# Patient Record
Sex: Female | Born: 1990
Health system: Southern US, Community
[De-identification: ages and names within clinical notes are randomized; demographics above are authoritative.]

## PROBLEM LIST (undated history)

## (undated) DIAGNOSIS — S52509A Unspecified fracture of the lower end of unspecified radius, initial encounter for closed fracture: Secondary | ICD-10-CM

## (undated) DIAGNOSIS — S42309A Unspecified fracture of shaft of humerus, unspecified arm, initial encounter for closed fracture: Secondary | ICD-10-CM

## (undated) DIAGNOSIS — S5291XA Unspecified fracture of right forearm, initial encounter for closed fracture: Secondary | ICD-10-CM

## (undated) DIAGNOSIS — R002 Palpitations: Secondary | ICD-10-CM

## (undated) DIAGNOSIS — E739 Lactose intolerance, unspecified: Secondary | ICD-10-CM

## (undated) DIAGNOSIS — G43909 Migraine, unspecified, not intractable, without status migrainosus: Secondary | ICD-10-CM

## (undated) DIAGNOSIS — J45909 Unspecified asthma, uncomplicated: Secondary | ICD-10-CM

## (undated) DIAGNOSIS — E162 Hypoglycemia, unspecified: Secondary | ICD-10-CM

## (undated) DIAGNOSIS — F4310 Post-traumatic stress disorder, unspecified: Secondary | ICD-10-CM

## (undated) HISTORY — PX: KNEE SURGERY: SHX244

## (undated) HISTORY — DX: Unspecified fracture of shaft of humerus, unspecified arm, initial encounter for closed fracture: S42.309A

## (undated) HISTORY — DX: Unspecified fracture of the lower end of unspecified radius, initial encounter for closed fracture: S52.509A

## (undated) HISTORY — DX: Palpitations: R00.2

## (undated) HISTORY — DX: Lactose intolerance, unspecified: E73.9

## (undated) HISTORY — DX: Post-traumatic stress disorder, unspecified: F43.10

## (undated) HISTORY — PX: WISDOM TOOTH EXTRACTION: SHX21

## (undated) HISTORY — DX: Unspecified fracture of right forearm, initial encounter for closed fracture: S52.91XA

## (undated) HISTORY — PX: CLAVICLE SURGERY: SHX598

---
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
## 2009-05-25 ENCOUNTER — Ambulatory Visit: Payer: Self-pay | Admitting: Internal Medicine

## 2010-01-16 ENCOUNTER — Ambulatory Visit: Payer: Self-pay | Admitting: Orthopedic Surgery

## 2010-01-23 ENCOUNTER — Ambulatory Visit: Payer: Self-pay | Admitting: Orthopedic Surgery

## 2011-07-09 DIAGNOSIS — J45909 Unspecified asthma, uncomplicated: Secondary | ICD-10-CM | POA: Insufficient documentation

## 2011-07-09 DIAGNOSIS — E162 Hypoglycemia, unspecified: Secondary | ICD-10-CM | POA: Insufficient documentation

## 2012-08-24 DIAGNOSIS — Z309 Encounter for contraceptive management, unspecified: Secondary | ICD-10-CM | POA: Insufficient documentation

## 2012-08-24 DIAGNOSIS — Z124 Encounter for screening for malignant neoplasm of cervix: Secondary | ICD-10-CM | POA: Insufficient documentation

## 2014-11-29 ENCOUNTER — Ambulatory Visit
Admission: EM | Admit: 2014-11-29 | Discharge: 2014-11-29 | Disposition: A | Discharge status: Home / Self Care | Payer: Self-pay | Attending: Family Medicine | Admitting: Family Medicine

## 2014-11-29 ENCOUNTER — Ambulatory Visit: Admission: EM | Admit: 2014-11-29 | Discharge: 2014-11-29 | Discharge status: Absent without leave | Payer: Self-pay

## 2014-11-29 ENCOUNTER — Encounter: Payer: Self-pay | Admitting: Family Medicine

## 2014-11-29 DIAGNOSIS — M940 Chondrocostal junction syndrome [Tietze]: Secondary | ICD-10-CM

## 2014-11-29 DIAGNOSIS — S29012A Strain of muscle and tendon of back wall of thorax, initial encounter: Secondary | ICD-10-CM

## 2014-11-29 HISTORY — DX: Unspecified asthma, uncomplicated: J45.909

## 2014-11-29 HISTORY — DX: Migraine, unspecified, not intractable, without status migrainosus: G43.909

## 2014-11-29 HISTORY — DX: Hypoglycemia, unspecified: E16.2

## 2014-11-29 MED ORDER — CYCLOBENZAPRINE HCL 10 MG PO TABS: 10.0000 mg | ORAL_TABLET | Freq: Every day | ORAL | Status: DC

## 2014-11-29 NOTE — ED Provider Notes (Signed)
CSN: [id number]     Arrival date & time 11/29/14  1541 History   First MD Initiated Contact with Patient 11/29/14 1619     Chief Complaint  Patient presents with  . Chest Pain   (Consider location/radiation/quality/duration/timing/severity/associated sxs/prior Treatment) Patient is a 24 y.o. female presenting with chest pain. The history is provided by the patient.  Chest Pain Pain location:  L chest Pain quality: sharp and stabbing   Pain quality: not aching, not burning, not crushing, no pressure and no tightness   Pain radiates to:  Does not radiate Pain radiates to the back: no   Pain severity:  Mild Onset quality:  Sudden Duration:  6 hours Timing:  Intermittent Progression:  Waxing and waning Context: at rest   Worsened by:  Certain positions, movement and deep breathing Ineffective treatments:  None tried Associated symptoms: no fever, no numbness, no orthopnea, no palpitations, no PND, no shortness of breath, no syncope, not vomiting and no weakness   Risk factors comment:  States had a recent URI with coughing last week and also has a 89 yr old child whom she picks up and carries   Past Medical History  Diagnosis Date  . Asthma   . Migraines   . Hypoglycemia    Past Surgical History  Procedure Laterality Date  . Clavicle surgery Left 2007   Family History  Problem Relation Age of Onset  . Bipolar disorder Mother   . Hypothyroidism Mother   . Restless legs syndrome Mother   . Neuropathy Father    History  Substance Use Topics  . Smoking status: Never Smoker   . Smokeless tobacco: Not on file  . Alcohol Use: Yes     Comment: occasional   OB History    Gravida Para Term Preterm AB TAB SAB Ectopic Multiple Living   1              Review of Systems  Constitutional: Negative for fever.  Respiratory: Negative for shortness of breath.   Cardiovascular: Positive for chest pain. Negative for palpitations, orthopnea, syncope and PND.  Gastrointestinal:  Negative for vomiting.  Neurological: Negative for weakness and numbness.    Allergies  Morphine and related and Percocet  Home Medications   Prior to Admission medications   Medication Sig Start Date End Date Taking? Authorizing Provider  albuterol (PROVENTIL HFA;VENTOLIN HFA) 108 (90 BASE) MCG/ACT inhaler Inhale 2 puffs into the lungs every 6 (six) hours as needed for wheezing or shortness of breath.   Yes Historical Provider, MD  etonogestrel (NEXPLANON) 68 MG IMPL implant 1 each by Subdermal route once.   Yes Historical Provider, MD  ondansetron (ZOFRAN) 4 MG tablet Take 4 mg by mouth every 8 (eight) hours as needed for nausea or vomiting.   Yes Historical Provider, MD  SUMAtriptan (IMITREX) 100 MG tablet Take 100 mg by mouth every 2 (two) hours as needed for migraine. May repeat in 2 hours if headache persists or recurs.   Yes Historical Provider, MD  cyclobenzaprine (FLEXERIL) 10 MG tablet Take 1 tablet (10 mg total) by mouth at bedtime. 11/29/14   Payton Mccallum, MD   BP 105/72 mmHg  Pulse 80  Temp(Src) 98 F (36.7 C) (Oral)  Resp 16  Ht  (1.727 m)  Wt 175 lb (79.379 kg)  BMI 26.61 kg/m2  SpO2 100%  LMP 11/27/2014 Physical Exam  Constitutional: She appears well-developed and well-nourished. No distress.  HENT:  Head: Normocephalic and atraumatic.  Cardiovascular: Normal  rate, regular rhythm, normal heart sounds and intact distal pulses.   No murmur heard. Pulmonary/Chest: Effort normal and breath sounds normal. No respiratory distress. She has no wheezes. She has no rales. She exhibits tenderness (reproducible left upper chest wall tenderness to palpation).  Musculoskeletal:       Cervical back: She exhibits tenderness (tenderness and spasm to left trapezius muscle).       Back:  Neurological: She is alert.  Skin: She is not diaphoretic.    ED Course  Procedures (including critical care time) Labs Review Labs Reviewed - No data to display  Imaging Review No  results found.   MDM   1. Costochondritis   2. Upper back strain, initial encounter    Discharge Medication List as of 11/29/2014  4:32 PM    START taking these medications   Details  cyclobenzaprine (FLEXERIL) 10 MG tablet Take 1 tablet (10 mg total) by mouth at bedtime., Starting 11/29/2014, Until Discontinued, Normal      Plan: 1.Diagnosis reviewed with patient 2. rx as per orders; risks, benefits, potential side effects reviewed with patient 3. Recommend supportive treatment with rest, ice, gentle stretching 4. F/u prn if symptoms worsen or don't improve    Payton Mccallumrlando Aunisty Reali, MD 11/29/14 [phone]

## 2014-11-29 NOTE — Discharge Instructions (Signed)
Costochondritis Costochondritis, sometimes called Tietze syndrome, is a swelling and irritation (inflammation) of the tissue (cartilage) that connects your ribs with your breastbone (sternum). It causes pain in the chest and rib area. Costochondritis usually goes away on its own over time. It can take up to 6 weeks or longer to get better, especially if you are unable to limit your activities. CAUSES  Some cases of costochondritis have no known cause. Possible causes include:  Injury (trauma).  Exercise or activity such as lifting.  Severe coughing. SIGNS AND SYMPTOMS  Pain and tenderness in the chest and rib area.  Pain that gets worse when coughing or taking deep breaths.  Pain that gets worse with specific movements. DIAGNOSIS  Your health care provider will do a physical exam and ask about your symptoms. Chest X-rays or other tests may be done to rule out other problems. TREATMENT  Costochondritis usually goes away on its own over time. Your health care provider may prescribe medicine to help relieve pain. HOME CARE INSTRUCTIONS   Avoid exhausting physical activity. Try not to strain your ribs during normal activity. This would include any activities using chest, abdominal, and side muscles, especially if heavy weights are used.  Apply ice to the affected area for the first 2 days after the pain begins.  Put ice in a plastic bag.  Place a towel between your skin and the bag.  Leave the ice on for 20 minutes, 2-3 times a day.  Only take over-the-counter or prescription medicines as directed by your health care provider. SEEK MEDICAL CARE IF:  You have redness or swelling at the rib joints. These are signs of infection.  Your pain does not go away despite rest or medicine. SEEK IMMEDIATE MEDICAL CARE IF:   Your pain increases or you are very uncomfortable.  You have shortness of breath or difficulty breathing.  You cough up blood.  You have worse chest pains,  sweating, or vomiting.  You have a fever or persistent symptoms for more than 2-3 days.  You have a fever and your symptoms suddenly get worse. MAKE SURE YOU:   Understand these instructions.  Will watch your condition.  Will get help right away if you are not doing well or get worse. Document Released: 04/02/2005 Document Revised: 04/13/2013 Document Reviewed: 01/25/2013 ExitCare Patient Information 2015 [employer]. This information is not intended to replace advice given to you by your health care provider. Make sure you discuss any questions you have with your health care provider.  

## 2014-11-29 NOTE — ED Notes (Signed)
Patient states all day today she has been having chest tightness off and on. In the last 2 hours she has had chest pain and her left hand feels cold.

## 2015-01-18 ENCOUNTER — Ambulatory Visit
Admission: EM | Admit: 2015-01-18 | Discharge: 2015-01-18 | Disposition: A | Discharge status: Home / Self Care | Payer: Managed Care, Other (non HMO) | Attending: Internal Medicine | Admitting: Internal Medicine

## 2015-01-18 ENCOUNTER — Encounter: Payer: Self-pay | Admitting: Emergency Medicine

## 2015-01-18 DIAGNOSIS — B349 Viral infection, unspecified: Secondary | ICD-10-CM

## 2015-01-18 DIAGNOSIS — R51 Headache: Secondary | ICD-10-CM

## 2015-01-18 DIAGNOSIS — R519 Headache, unspecified: Secondary | ICD-10-CM

## 2015-01-18 MED ORDER — KETOROLAC TROMETHAMINE 10 MG PO TABS: 10.0000 mg | ORAL_TABLET | Freq: Four times a day (QID) | ORAL | Status: DC | PRN

## 2015-01-18 MED ORDER — SUMATRIPTAN SUCCINATE 6 MG/0.5ML ~~LOC~~ SOAJ: 1.0000 | Freq: Once | SUBCUTANEOUS | Status: DC

## 2015-01-18 MED ORDER — SUMATRIPTAN SUCCINATE 6 MG/0.5ML ~~LOC~~ SOLN
6.0000 mg | Freq: Once | SUBCUTANEOUS | Status: AC
  Administered 2015-01-18: 6 mg via SUBCUTANEOUS

## 2015-01-18 MED ORDER — PROMETHAZINE HCL 25 MG/ML IJ SOLN
25.0000 mg | Freq: Once | INTRAMUSCULAR | Status: AC
  Administered 2015-01-18: 25 mg via INTRAMUSCULAR

## 2015-01-18 MED ORDER — KETOROLAC TROMETHAMINE 60 MG/2ML IM SOLN
60.0000 mg | Freq: Once | INTRAMUSCULAR | Status: AC
  Administered 2015-01-18: 60 mg via INTRAMUSCULAR

## 2015-01-18 NOTE — Discharge Instructions (Signed)
Anticipate improvement in headache, other symptoms, gradually over the next couple days. Prescriptions for imitrex injection and toradol tablets (anti inflammatory) were sent to the pharmacy.  Viral Infections A virus is a type of germ. Viruses can cause:  Minor sore throats.  Aches and pains.  Headaches.  Runny nose.  Rashes.  Watery eyes.  Tiredness.  Coughs.  Loss of appetite.  Feeling sick to your stomach (nausea).  Throwing up (vomiting).  Watery poop (diarrhea). HOME CARE   Only take medicines as told by your doctor.  Drink enough water and fluids to keep your pee (urine) clear or pale yellow. Sports drinks are a good choice.  Get plenty of rest and eat healthy. Soups and broths with crackers or rice are fine. GET HELP RIGHT AWAY IF:   You have a very bad headache.  You have shortness of breath.  You have chest pain or neck pain.  You have an unusual rash.  You cannot stop throwing up.  You have watery poop that does not stop.  You cannot keep fluids down.  You or your child has a temperature by mouth above 102 F (38.9 C), not controlled by medicine.  Your baby is older than 3 months with a rectal temperature of 102 F (38.9 C) or higher.  Your baby is 3 months old or younger with a rectal temperatu45re of 100.4 F (38 C) or higher. MAKE SURE YOU:   Understand these instructions.  Will watch this condition.  Will get help right away if you are not doing well or get worse. Document Released: 06/05/2008 Document Revised: 09/15/2011 Document Reviewed: 10/29/2010 [facility] Patient Information 2015 [place], [place]. This information is not intended to replace advice given to you by your health care provider. Make sure you discuss any questions you have with your health care provider.

## 2015-01-18 NOTE — ED Notes (Addendum)
Patient c/o migraine headache that started yesterday.  Patient states N/V started this morning. Patient states that she took some Imitrex around 5:00am this morning.  Patient states that she took some Zofran around 5:30am this morning but vomited soon after taking it.

## 2015-01-18 NOTE — ED Provider Notes (Signed)
CSN: [id number]     Arrival date & time 01/18/15  0741 History   First MD Initiated Contact with Patient 01/18/15 [phone]     Chief Complaint  Patient presents with  . Migraine  . Emesis    HPI Patient is a 24 y.o. female presenting with migraines and vomiting.  Migraine  Emesis  She has a young child at home, was diagnosed a few days ago with hand-foot-and-mouth disease, and an upper respiratory infection. Patient has a headache since yesterday that started at the back of her head, radiates up over the top, which is a little bit different from her usual headache pattern. No stiff neck, no fever. She has had some runny/congested nose, a little cough. Headache yesterday responded to Imitrex at home; unfortunately, headache is flared up again this morning, and she vomited up the Zofran she took earlier to help.  Past Medical History  Diagnosis Date  . Asthma   . Migraines   . Hypoglycemia    Past Surgical History  Procedure Laterality Date  . Clavicle surgery Left 2007  . Knee surgery Left    Family History  Problem Relation Age of Onset  . Bipolar disorder Mother   . Hypothyroidism Mother   . Restless legs syndrome Mother   . Neuropathy Father    History  Substance Use Topics  . Smoking status: Never Smoker   . Smokeless tobacco: Not on file  . Alcohol Use: Yes     Comment: occasional   OB History    Gravida Para Term Preterm AB TAB SAB Ectopic Multiple Living   1              Review of Systems  Gastrointestinal: Positive for vomiting.  All other systems reviewed and are negative.   Allergies  Morphine and related and Percocet  Home Medications   Prior to Admission medications   Medication Sig Start Date End Date Taking? Authorizing Provider  albuterol (PROVENTIL HFA;VENTOLIN HFA) 108 (90 BASE) MCG/ACT inhaler Inhale 2 puffs into the lungs every 6 (six) hours as needed for wheezing or shortness of breath.    Historical Provider, MD  etonogestrel (NEXPLANON) 68  MG IMPL implant 1 each by Subdermal route once.    Historical Provider, MD  ketorolac (TORADOL) 10 MG tablet Take 1 tablet (10 mg total) by mouth every 6 (six) hours as needed. 01/18/15   Eustace Moore, MD  ondansetron (ZOFRAN) 4 MG tablet Take 4 mg by mouth every 8 (eight) hours as needed for nausea or vomiting.    Historical Provider, MD  SUMAtriptan (IMITREX) 100 MG tablet Take 100 mg by mouth every 2 (two) hours as needed for migraine. May repeat in 2 hours if headache persists or recurs.    Historical Provider, MD  SUMAtriptan 6 MG/0.5ML SOAJ Inject 1 Dose into the skin once. Can repeat dose in an hour if not improving.  Max 2 doses in 24 hours. 01/18/15   Eustace Moore, MD   BP 106/57 mmHg  Pulse 101  Temp(Src) 98 F (36.7 C) (Oral)  Resp 16  Ht  (1.702 m)  Wt 170 lb (77.111 kg)  BMI 26.62 kg/m2  SpO2 99% Physical Exam  Constitutional: She is oriented to person, place, and time.  Alert, nicely groomed Laying on stretcher with head covered up, in darkened exam room  HENT:  Head: Atraumatic.  Eyes:  Conjugate gaze, no eye redness/drainage  Neck: Neck supple.  Cardiovascular: Normal rate.   Pulmonary/Chest:  No respiratory distress.  Lungs clear, symmetric breath sounds  Abdominal: She exhibits no distension.  Musculoskeletal: Normal range of motion.  No leg swelling  Neurological: She is alert and oriented to person, place, and time.  Face symmetric, speech clear/coherent Walked into urgent care independently  Skin: Skin is warm and dry.  No cyanosis  Nursing note and vitals reviewed.   ED Course  Procedures   Medications  ketorolac (TORADOL) injection 60 mg (60 mg Intramuscular Given 01/18/15 0820)  promethazine (PHENERGAN) injection 25 mg (25 mg Intramuscular Given 01/18/15 0818)  SUMAtriptan (IMITREX) injection 6 mg (6 mg Subcutaneous Given 01/18/15 0856)   Marked improvement in headache after meds MDM   1. Acute viral syndrome   2. Headache, unspecified  headache type    Prescription for Imitrex injections. Recheck for persistent or worsening symptoms    Eustace MooreLaura W Lucile Hillmann, MD 01/18/15 2101

## 2015-01-18 NOTE — ED Notes (Signed)
Family at bedside. Patient given a warm blanket and is resting comfortably.

## 2015-02-22 ENCOUNTER — Emergency Department (HOSPITAL_COMMUNITY)
Admission: EM | Admit: 2015-02-22 | Discharge: 2015-02-22 | Disposition: A | Discharge status: Home / Self Care | Payer: 59 | Source: Home / Self Care

## 2015-03-19 DIAGNOSIS — R002 Palpitations: Secondary | ICD-10-CM | POA: Insufficient documentation

## 2015-03-19 HISTORY — DX: Palpitations: R00.2

## 2015-04-02 ENCOUNTER — Ambulatory Visit: Payer: Self-pay | Admitting: Unknown Physician Specialty

## 2015-04-04 ENCOUNTER — Ambulatory Visit (INDEPENDENT_AMBULATORY_CARE_PROVIDER_SITE_OTHER): Payer: 59 | Admitting: Unknown Physician Specialty

## 2015-04-04 ENCOUNTER — Ambulatory Visit
Admission: RE | Admit: 2015-04-04 | Discharge: 2015-04-04 | Disposition: A | Discharge status: Home / Self Care | Payer: 59 | Source: Ambulatory Visit | Attending: Unknown Physician Specialty | Admitting: Unknown Physician Specialty

## 2015-04-04 ENCOUNTER — Encounter: Payer: Self-pay | Admitting: Unknown Physician Specialty

## 2015-04-04 VITALS — BP 118/70 | HR 79 | Temp 98.8°F | Ht 67.9 in | Wt 167.4 lb

## 2015-04-04 DIAGNOSIS — Z23 Encounter for immunization: Secondary | ICD-10-CM | POA: Diagnosis not present

## 2015-04-04 DIAGNOSIS — J45990 Exercise induced bronchospasm: Secondary | ICD-10-CM

## 2015-04-04 DIAGNOSIS — R002 Palpitations: Secondary | ICD-10-CM

## 2015-04-04 DIAGNOSIS — R6889 Other general symptoms and signs: Secondary | ICD-10-CM

## 2015-04-04 DIAGNOSIS — R1010 Upper abdominal pain, unspecified: Secondary | ICD-10-CM | POA: Diagnosis not present

## 2015-04-04 DIAGNOSIS — R0789 Other chest pain: Secondary | ICD-10-CM

## 2015-04-04 MED ORDER — ALBUTEROL SULFATE HFA 108 (90 BASE) MCG/ACT IN AERS: 2.0000 | INHALATION_SPRAY | Freq: Four times a day (QID) | RESPIRATORY_TRACT | Status: DC | PRN

## 2015-04-04 NOTE — Progress Notes (Signed)
+-------------+--+  BP 118/70 mmHg  Pulse 79  Temp(Src) 98.8 F (37.1 C)  Ht 5' 7.9" (1.725 m)  Wt 167 lb 6.4 oz (75.932 kg)  BMI 25.52 kg/m2  SpO2 98%   Subjective:    Patient ID: Miranda Mcdonald, female    DOB: 1990-12-11, 24 y.o.   MRN: [id number]  HPI: Miranda Mcdonald is a 24 y.o. female  Chief Complaint  Patient presents with  . Establish Care  . Medication Refill    pt would like new rx for albuterol inhaler   Abdominal pain Pt states she is having "gall bladder spasms."  States it starts in the RUQ and radiates to the back.  Lately it has also been on the left side.  Pain is constant but varies form a 3 to a 10. Nothing seems to make it worse or better.  States she went to the [facility] ER at one point and had no stones and clinical diagnosis made of sludge in the bowel.  Last [place] was 2 years ago.   States when the pain is severe she can't eat which is bad because she does get hypoglymic.    Hypoglycemic States she sticks with a low sugar diet with occasional artificial sugar. States she stays away from simple carbohydrates.  Eats small frequent meals and focuses on protein.     Palpitations States she is getting palpitation with exercise.  She was sent to a heart clinic.  She had a stress test.  She is on a 30 day monitor.  They did a cholesterol and thyroid panel. She doesn't feel like she is being taken seriously and would like another opinion.      Asthma Primary symptoms comments: Would like a refill of her albuterol that she takes on occasion when running. She flares once in a while with a URI. Her past medical history is significant for asthma.    Relevant past medical, surgical, family and social history reviewed and updated as indicated. Interim medical history since our last visit reviewed. Allergies and medications reviewed and updated.  FH is significant for DM.  No real heart disease.    Review of Systems  Constitutional: Negative.   HENT: Negative.   Eyes:  Negative.   Gastrointestinal: Negative.   Endocrine: Negative.   Genitourinary: Negative.   Musculoskeletal: Negative.   Skin: Negative.   Allergic/Immunologic: Negative.   Neurological: Negative.   Psychiatric/Behavioral: Negative.     Per HPI unless specifically indicated above     Objective:    BP 118/70 mmHg  Pulse 79  Temp(Src) 98.8 F (37.1 C)  Ht 5' 7.9" (1.725 m)  Wt 167 lb 6.4 oz (75.932 kg)  BMI 25.52 kg/m2  SpO2 98%  Wt Readings from Last 3 Encounters:  04/04/15 167 lb 6.4 oz (75.932 kg)  01/18/15 170 lb (77.111 kg)  11/29/14 175 lb (79.379 kg)    Physical Exam  Constitutional: She is oriented to person, place, and time. She appears well-developed and well-nourished. No distress.  HENT:  Head: Normocephalic and atraumatic.  Eyes: Conjunctivae and lids are normal. Right eye exhibits no discharge. Left eye exhibits no discharge. No scleral icterus.  Cardiovascular: Normal rate, regular rhythm and normal heart sounds.   Pulmonary/Chest: Effort normal and breath sounds normal. No respiratory distress.  Abdominal: Normal appearance and bowel sounds are normal. She exhibits no distension. There is no splenomegaly or hepatomegaly. There is no tenderness.  Pain seems to be more in her rib cage with exquisite tenderness  with palpation of lower ribs.    Musculoskeletal: Normal range of motion.  Neurological: She is alert and oriented to person, place, and time.  Skin: Skin is intact. No rash noted. No pallor.  Psychiatric: She has a normal mood and affect. Her behavior is normal. Judgment and thought content normal.    No results found for this or any previous visit.    Assessment & Plan:   Problem List Items Addressed This Visit      Unprioritized   Exercise-induced asthma   Relevant Medications   albuterol (PROVENTIL HFA;VENTOLIN HFA) 108 (90 BASE) MCG/ACT inhaler    Other Visit Diagnoses    Immunization due    -  Primary    Relevant Orders    Tdap vaccine  greater than or equal to 7yo IM (Completed)    Pain of upper abdomen        check [place].  Labs to r/o gall bladder.      Relevant Orders    US Abdomen Complete    Comprehensive metabolic panel    CBC with Differential/Platelet    Amylase    Lipase    Tissue transglutaminase, IgA    Chest wall pain        chest x-ray    Relevant Orders    DG Chest 2 View    Decreased exercise tolerance        Relevant Orders    Ambulatory referral to Cardiology    Heart palpitations        Refer to cardiology    Relevant Orders    Ambulatory referral to Cardiology        Follow up plan: Return in about 4 weeks (around 05/02/2015).

## 2015-04-05 LAB — CBC WITH DIFFERENTIAL/PLATELET
BASOS ABS: 0 10*3/uL (ref 0.0–0.2)
BASOS: 0 %
EOS (ABSOLUTE): 0 10*3/uL (ref 0.0–0.4)
Eos: 0 %
Hematocrit: 42.8 % (ref 34.0–46.6)
Hemoglobin: 14.4 g/dL (ref 11.1–15.9)
IMMATURE GRANULOCYTES: 0 %
Immature Grans (Abs): 0 10*3/uL (ref 0.0–0.1)
LYMPHS: 26 %
Lymphocytes Absolute: 1.5 10*3/uL (ref 0.7–3.1)
MCH: 29.1 pg (ref 26.6–33.0)
MCHC: 33.6 g/dL (ref 31.5–35.7)
MCV: 87 fL (ref 79–97)
MONOCYTES: 7 %
Monocytes Absolute: 0.4 10*3/uL (ref 0.1–0.9)
NEUTROS ABS: 4 10*3/uL (ref 1.4–7.0)
NEUTROS PCT: 67 %
PLATELETS: 271 10*3/uL (ref 150–379)
RBC: 4.95 x10E6/uL (ref 3.77–5.28)
RDW: 13.1 % (ref 12.3–15.4)
WBC: 6 10*3/uL (ref 3.4–10.8)

## 2015-04-05 LAB — COMPREHENSIVE METABOLIC PANEL
ALT: 7 IU/L (ref 0–32)
AST: 10 IU/L (ref 0–40)
Albumin/Globulin Ratio: 2.1 (ref 1.1–2.5)
Albumin: 4.7 g/dL (ref 3.5–5.5)
Alkaline Phosphatase: 54 IU/L (ref 39–117)
BUN/Creatinine Ratio: 14 (ref 8–20)
BUN: 9 mg/dL (ref 6–20)
Bilirubin Total: 0.4 mg/dL (ref 0.0–1.2)
CALCIUM: 9.8 mg/dL (ref 8.7–10.2)
CO2: 23 mmol/L (ref 18–29)
Chloride: 102 mmol/L (ref 97–108)
Creatinine, Ser: 0.65 mg/dL (ref 0.57–1.00)
GFR, EST AFRICAN AMERICAN: 143 mL/min/{1.73_m2} (ref >59)
GFR, EST NON AFRICAN AMERICAN: 124 mL/min/{1.73_m2} (ref >59)
GLUCOSE: 86 mg/dL (ref 65–99)
Globulin, Total: 2.2 g/dL (ref 1.5–4.5)
POTASSIUM: 4 mmol/L (ref 3.5–5.2)
Sodium: 143 mmol/L (ref 134–144)
TOTAL PROTEIN: 6.9 g/dL (ref 6.0–8.5)

## 2015-04-05 LAB — AMYLASE: AMYLASE: 57 U/L (ref 31–124)

## 2015-04-05 LAB — LIPASE: Lipase: 18 U/L (ref 0–59)

## 2015-04-05 NOTE — Progress Notes (Signed)
Quick Note:  Normal labs. Notified pt by mychart ______

## 2015-04-06 LAB — TISSUE TRANSGLUTAMINASE, IGA: Transglutaminase IgA: <2 U/mL (ref 0–3)

## 2015-04-10 ENCOUNTER — Other Ambulatory Visit: Payer: Self-pay | Admitting: Unknown Physician Specialty

## 2015-04-10 ENCOUNTER — Ambulatory Visit
Admission: RE | Admit: 2015-04-10 | Discharge: 2015-04-10 | Disposition: A | Discharge status: Home / Self Care | Payer: 59 | Source: Ambulatory Visit | Attending: Unknown Physician Specialty | Admitting: Unknown Physician Specialty

## 2015-04-10 DIAGNOSIS — R1010 Upper abdominal pain, unspecified: Secondary | ICD-10-CM | POA: Insufficient documentation

## 2015-04-10 DIAGNOSIS — R1011 Right upper quadrant pain: Secondary | ICD-10-CM

## 2015-04-16 ENCOUNTER — Telehealth: Payer: Self-pay | Admitting: Unknown Physician Specialty

## 2015-04-16 DIAGNOSIS — Z01818 Encounter for other preprocedural examination: Secondary | ICD-10-CM

## 2015-04-16 NOTE — Telephone Encounter (Signed)
Ordered

## 2015-04-16 NOTE — Telephone Encounter (Signed)
Pt would like a call back to discuss a test she needs to have taken. Not sure what the test/exam is

## 2015-04-16 NOTE — Telephone Encounter (Signed)
Called and left patient a voicemail asking for her to return my call. Miranda Mcdonald said that we need to order a serum pregnancy per nuclear medicine protocol before they can schedule the test. This information is under the chart review tab and under referrals.

## 2015-04-17 NOTE — Telephone Encounter (Signed)
Called and spoke to patient. Patient stated that she thought I called her about an appointment but I told her that I did not. But I did schedule the patient a lab visit to get the serum pregnancy test drawn for the MRI on 04/23/15.

## 2015-04-23 ENCOUNTER — Other Ambulatory Visit: Payer: Self-pay

## 2015-04-23 ENCOUNTER — Telehealth: Payer: 59 | Admitting: Family

## 2015-04-23 DIAGNOSIS — B349 Viral infection, unspecified: Secondary | ICD-10-CM | POA: Diagnosis not present

## 2015-04-23 DIAGNOSIS — J329 Chronic sinusitis, unspecified: Secondary | ICD-10-CM | POA: Diagnosis not present

## 2015-04-23 DIAGNOSIS — B9789 Other viral agents as the cause of diseases classified elsewhere: Secondary | ICD-10-CM

## 2015-04-23 MED ORDER — FLUTICASONE PROPIONATE 50 MCG/ACT NA SUSP: 1.0000 | Freq: Two times a day (BID) | NASAL | Status: DC | PRN

## 2015-04-23 NOTE — Progress Notes (Signed)
We are sorry that you are not feeling well.  Here is how we plan to help!  *At this point in time, it is too early for sure to call it the flu for sure. It meets the criteria more closely with acute viral sinusitis or acute viral upper respiratory infection. Please see the guidelines below.   Based on what you have shared with me it looks like you have sinusitis.  Sinusitis is inflammation and infection in the sinus cavities of the head.  Based on your presentation I believe you most likely have Acute Viral Sinusitis. This is an infection most likely caused by a virus.  There is not specific treatment for viral sinusitis other than to help you with the symptoms until the infection runs it's course.  You may use an oral decongestant such as Mucinex D or if you have glaucoma or high blood pressure use plain Mucinex.  Saline nasal spray help and can safely be used as often as needed for congestion, I have prescribed fluticason nasal spray. Spray two sprays in each nostril twice a day to help reduce your symptoms.  Some authorities believe that zinc sprays or the use of Echinacea may shorten the course of your symptoms.  Sinus infections are not as easily transmitted as other respiratory infection, however we still recommend that you avoid close contact with loved ones, especially the very young and elderly.  Remember to wash your hands thoroughly throughout the day as this is the number one way to prevent the spread of infection!  Home Care:  Only take medications as instructed by your medical team.  Complete the entire course of an antibiotic.  Do not take these medications with alcohol.  A steam or ultrasonic humidifier can help congestion.  You can place a towel over your head and breathe in the steam from hot water coming from a faucet.  Avoid close contacts especially the very young and the elderly.  Cover your mouth when you cough or sneeze.  Always remember to wash your hands.  Get Help  Right Away If:  You develop worsening fever or sinus pain.  You develop a severe head ache or visual changes.  Your symptoms persist after you have completed your treatment plan.  Make sure you  Understand these instructions.  Will watch your condition.  Will get help right away if you are not doing well or get worse.  Your e-visit answers were reviewed by a board certified advanced clinical practitioner to complete your personal care plan.  Depending on the condition, your plan could have included both over the counter or prescription medications.  If there is a problem please reply  once you have received a response from your provider.  Your safety is important to us.  If you have drug allergies check your prescription carefully.    You can use MyChart to ask questions about today's visit, request a non-urgent call back, or ask for a work or school excuse for 24 hours related to this e-Visit. If it has been greater than 24 hours you will need to follow up with your provider, or enter a new e-Visit to address those concerns.  You will get an e-mail in the next two days asking about your experience.  I hope that your e-visit has been valuable and will speed your recovery. Thank you for using e-visits.

## 2015-04-30 ENCOUNTER — Other Ambulatory Visit: Payer: Self-pay | Admitting: Unknown Physician Specialty

## 2015-04-30 ENCOUNTER — Ambulatory Visit
Admission: RE | Admit: 2015-04-30 | Discharge: 2015-04-30 | Disposition: A | Discharge status: Home / Self Care | Payer: 59 | Source: Ambulatory Visit | Attending: Unknown Physician Specialty | Admitting: Unknown Physician Specialty

## 2015-04-30 ENCOUNTER — Other Ambulatory Visit
Admission: RE | Admit: 2015-04-30 | Discharge: 2015-04-30 | Disposition: A | Discharge status: Home / Self Care | Payer: 59 | Source: Ambulatory Visit | Attending: *Deleted | Admitting: *Deleted

## 2015-04-30 ENCOUNTER — Telehealth: Payer: Self-pay | Admitting: Family Medicine

## 2015-04-30 DIAGNOSIS — Z01818 Encounter for other preprocedural examination: Secondary | ICD-10-CM | POA: Insufficient documentation

## 2015-04-30 DIAGNOSIS — R1011 Right upper quadrant pain: Secondary | ICD-10-CM

## 2015-04-30 LAB — HCG, QUANTITATIVE, PREGNANCY: hCG, Beta Chain, Quant, S: 1 m[IU]/mL (ref <5)

## 2015-04-30 NOTE — Telephone Encounter (Signed)
Please let her know that her pregnancy test came back negative.

## 2015-04-30 NOTE — Telephone Encounter (Signed)
Called to notify pt of result. No answer. Left VM for pt to return my call.

## 2015-04-30 NOTE — Telephone Encounter (Signed)
Patient returned my call and patient was notified about pregnancy test.

## 2015-05-04 ENCOUNTER — Ambulatory Visit (INDEPENDENT_AMBULATORY_CARE_PROVIDER_SITE_OTHER): Payer: 59 | Admitting: Unknown Physician Specialty

## 2015-05-04 ENCOUNTER — Encounter: Payer: Self-pay | Admitting: Unknown Physician Specialty

## 2015-05-04 VITALS — BP 107/73 | HR 71 | Temp 98.6°F | Ht 67.7 in | Wt 166.0 lb

## 2015-05-04 DIAGNOSIS — R1011 Right upper quadrant pain: Secondary | ICD-10-CM

## 2015-05-04 DIAGNOSIS — Z01812 Encounter for preprocedural laboratory examination: Secondary | ICD-10-CM

## 2015-05-04 DIAGNOSIS — R002 Palpitations: Secondary | ICD-10-CM | POA: Diagnosis not present

## 2015-05-04 NOTE — Progress Notes (Signed)
BP 107/73 mmHg  Pulse 71  Temp(Src) 98.6 F (37 C)  Ht 5' 7.7" (1.72 m)  Wt 166 lb (75.297 kg)  BMI 25.45 kg/m2  SpO2 99%   Subjective:    Patient ID: Miranda Mcdonald, female    DOB: [birth year], 24 y.o.   MRN: [id number]  HPI: Miranda Mcdonald is a 24 y.o. female  Chief Complaint  Patient presents with  . Follow-up    pt states she is just here for a follow-up, was seen last for RUQ abdominal pain   Pt is here for f/u of her RUQ pain.  She has yet to get HIDA scan due to lab issues.  It is rescheduled for 7A on November 7th.   I do need to put an order in for a pregnancy test.  Pain has been good until the last 3 days when she att macaroni.    Palpitations from previous visit seems to have resolved with stopping caffeine.  Relevant past medical, surgical, family and social history reviewed and updated as indicated. Interim medical history since our last visit reviewed. Allergies and medications reviewed and updated.  Review of Systems  Per HPI unless specifically indicated above     Objective:    BP 107/73 mmHg  Pulse 71  Temp(Src) 98.6 F (37 C)  Ht 5' 7.7" (1.72 m)  Wt 166 lb (75.297 kg)  BMI 25.45 kg/m2  SpO2 99%  Wt Readings from Last 3 Encounters:  05/04/15 166 lb (75.297 kg)  04/04/15 167 lb 6.4 oz (75.932 kg)  01/18/15 170 lb (77.111 kg)    Physical Exam  Constitutional: She is oriented to person, place, and time. She appears well-developed and well-nourished. No distress.  HENT:  Head: Normocephalic and atraumatic.  Eyes: Conjunctivae and lids are normal. Right eye exhibits no discharge. Left eye exhibits no discharge. No scleral icterus.  Cardiovascular: Normal rate and regular rhythm.   Pulmonary/Chest: Effort normal. No respiratory distress.  Abdominal: Soft. Normal appearance and bowel sounds are normal. She exhibits abdominal bruit. She exhibits no shifting dullness, no distension, no pulsatile liver and no fluid wave. There is no splenomegaly or  hepatomegaly. There is no tenderness.  Tender right lower rib cage.    Musculoskeletal: Normal range of motion.  Neurological: She is alert and oriented to person, place, and time.  Skin: Skin is intact. No rash noted. No pallor.  Psychiatric: She has a normal mood and affect. Her behavior is normal. Judgment and thought content normal.    Results for orders placed or performed in visit on 04/30/15  hCG, quantitative, pregnancy  Result Value Ref Range   hCG, Beta Chain, Quant, S 1 <5 mIU/mL      Assessment & Plan:   Problem List Items Addressed This Visit      Unprioritized   RUQ pain    Seems to be a chronic problem.  [place] is negative.  Awaiting a HIDA scan.  Pregancy test entered.  I suspect problem may be related to right lower rib pain.  Consider rib films and CT scan if [place] is negative as pain has required multiple ER and Urgent care visits.         Other Visit Diagnoses    Pre-procedure lab exam    -  Primary    Relevant Orders    hCG, serum, qualitative    Heart palpitations        resolved.  Will DC cardiology appointment  Follow up plan: Return for HIDA scan results.

## 2015-05-04 NOTE — Assessment & Plan Note (Signed)
Seems to be a chronic problem.  US is negative.  Awaiting a HIDA scan.  Pregancy test entered.  I suspect problem may be related to right lower rib pain.  Consider rib films and CT scan if US is negative as pain has required multiple ER and Urgent care visits.

## 2015-05-11 ENCOUNTER — Other Ambulatory Visit: Payer: Self-pay

## 2015-05-11 ENCOUNTER — Other Ambulatory Visit: Payer: 59

## 2015-05-11 DIAGNOSIS — Z01812 Encounter for preprocedural laboratory examination: Secondary | ICD-10-CM

## 2015-05-11 DIAGNOSIS — Z01818 Encounter for other preprocedural examination: Secondary | ICD-10-CM

## 2015-05-12 LAB — HCG, SERUM, QUALITATIVE: hCG,Beta Subunit,Qual,Serum: NEGATIVE m[IU]/mL (ref <6)

## 2015-05-14 ENCOUNTER — Encounter
Admission: RE | Admit: 2015-05-14 | Discharge: 2015-05-14 | Disposition: A | Discharge status: Home / Self Care | Payer: 59 | Source: Ambulatory Visit | Attending: Unknown Physician Specialty | Admitting: Unknown Physician Specialty

## 2015-05-14 DIAGNOSIS — R1011 Right upper quadrant pain: Secondary | ICD-10-CM | POA: Diagnosis not present

## 2015-05-14 MED ORDER — SINCALIDE 5 MCG IJ SOLR
0.0200 ug/kg | Freq: Once | INTRAMUSCULAR | Status: AC
  Administered 2015-05-14: 1.51 ug via INTRAVENOUS

## 2015-05-14 MED ORDER — TECHNETIUM TC 99M MEBROFENIN IV KIT
5.0000 | PACK | Freq: Once | INTRAVENOUS | Status: DC | PRN
  Administered 2015-05-14: 5.218 via INTRAVENOUS
  Filled 2015-05-14: qty 6

## 2015-05-23 ENCOUNTER — Ambulatory Visit: Payer: 59 | Admitting: Cardiovascular Disease

## 2015-11-12 ENCOUNTER — Ambulatory Visit
Admission: EM | Admit: 2015-11-12 | Discharge: 2015-11-12 | Disposition: A | Discharge status: Home / Self Care | Payer: 59 | Attending: Family Medicine | Admitting: Family Medicine

## 2015-11-12 DIAGNOSIS — R300 Dysuria: Secondary | ICD-10-CM

## 2015-11-12 DIAGNOSIS — R112 Nausea with vomiting, unspecified: Secondary | ICD-10-CM | POA: Diagnosis not present

## 2015-11-12 LAB — URINALYSIS COMPLETE WITH MICROSCOPIC (ARMC ONLY)
Bilirubin Urine: NEGATIVE
GLUCOSE, UA: NEGATIVE mg/dL
Hgb urine dipstick: NEGATIVE
Leukocytes, UA: NEGATIVE
NITRITE: NEGATIVE
PROTEIN: NEGATIVE mg/dL
SPECIFIC GRAVITY, URINE: 1.02 (ref 1.005–1.030)
pH: 6.5 (ref 5.0–8.0)

## 2015-11-12 LAB — HCG, QUANTITATIVE, PREGNANCY: hCG, Beta Chain, Quant, S: 1 m[IU]/mL (ref <5)

## 2015-11-12 LAB — PREGNANCY, URINE: Preg Test, Ur: NEGATIVE

## 2015-11-12 LAB — TSH: TSH: 1.308 u[IU]/mL (ref 0.350–4.500)

## 2015-11-12 MED ORDER — ONDANSETRON 8 MG PO TBDP
8.0000 mg | ORAL_TABLET | Freq: Once | ORAL | Status: AC
  Administered 2015-11-12: 8 mg via ORAL

## 2015-11-12 MED ORDER — SULFAMETHOXAZOLE-TRIMETHOPRIM 800-160 MG PO TABS: 1.0000 | ORAL_TABLET | Freq: Two times a day (BID) | ORAL | Status: DC

## 2015-11-14 LAB — URINE CULTURE: Special Requests: NORMAL

## 2015-11-23 DIAGNOSIS — Z01411 Encounter for gynecological examination (general) (routine) with abnormal findings: Secondary | ICD-10-CM | POA: Diagnosis not present

## 2015-11-23 DIAGNOSIS — R350 Frequency of micturition: Secondary | ICD-10-CM | POA: Diagnosis not present

## 2015-11-23 DIAGNOSIS — Z124 Encounter for screening for malignant neoplasm of cervix: Secondary | ICD-10-CM | POA: Diagnosis not present

## 2015-11-23 DIAGNOSIS — N644 Mastodynia: Secondary | ICD-10-CM | POA: Diagnosis not present

## 2015-11-23 DIAGNOSIS — N925 Other specified irregular menstruation: Secondary | ICD-10-CM | POA: Diagnosis not present

## 2015-12-14 ENCOUNTER — Ambulatory Visit: Payer: 59 | Admitting: Unknown Physician Specialty

## 2015-12-18 NOTE — ED Provider Notes (Signed)
CSN: [id number]     Arrival date & time 11/12/15  1430 History   First MD Initiated Contact with Patient 11/12/15 1520     Chief Complaint  Patient presents with  . Nausea    Fatigue x past few weeks, nausea and vomiting, breast soreness. Headache starting last night after drinking alcohol. Currently 1/10  Asking for pregnancy test.    (Consider location/radiation/quality/duration/timing/severity/associated sxs/prior Treatment) HPI Comments: 25 yo female with a c/o nausea, fatigue and breast discomfort for the past 2-3 weeks. Also complains of slight discomfort with urination.  Patient requesting a pregnancy test. Denies any fevers, chills, abdominal pain. States has had several episodes of vomiting, but no diarrhea or constipation.   The history is provided by the patient.    Past Medical History  Diagnosis Date  . Asthma   . Migraines   . Hypoglycemia   . Lactose intolerance in adult    Past Surgical History  Procedure Laterality Date  . Clavicle surgery Left 2007  . Knee surgery Left   . Clavicle surgery      surgery to remove pin from first surgery  . Wisdom tooth extraction     Family History  Problem Relation Age of Onset  . Bipolar disorder Mother   . Hypothyroidism Mother   . Restless legs syndrome Mother   . Neuropathy Father   . Depression Father   . Bipolar disorder Sister   . Hypothyroidism Sister   . Heart murmur Daughter   . Speech disorder Daughter   . Arthritis Maternal Grandmother   . Hypotension Maternal Grandmother   . Diabetes Maternal Grandfather   . Depression Maternal Grandfather   . Hyperlipidemia Maternal Grandfather   . Liver disease Maternal Grandfather     fatty liver  . Bipolar disorder Maternal Grandfather   . Hypothyroidism Maternal Grandfather   . Hypertension Paternal Grandmother   . Depression Paternal Grandmother   . Depression Paternal Grandfather    Social History  Substance Use Topics  . Smoking status: Never Smoker   .  Smokeless tobacco: Never Used  . Alcohol Use: Yes     Comment: occasional- pt states once in a while   OB History    Gravida Para Term Preterm AB TAB SAB Ectopic Multiple Living   1              Review of Systems  Allergies  Morphine and related and Percocet  Home Medications   Prior to Admission medications   Medication Sig Start Date End Date Taking? Authorizing Provider  albuterol (PROVENTIL HFA;VENTOLIN HFA) 108 (90 BASE) MCG/ACT inhaler Inhale 2 puffs into the lungs every 6 (six) hours as needed for wheezing or shortness of breath. 04/04/15  Yes Gabriel Cirri, NP  etonogestrel (NEXPLANON) 68 MG IMPL implant 1 each by Subdermal route once.   Yes Historical Provider, MD  ketorolac (TORADOL) 10 MG tablet Take 1 tablet (10 mg total) by mouth every 6 (six) hours as needed. 01/18/15  Yes Eustace Moore, MD  ondansetron (ZOFRAN) 4 MG tablet Take 4 mg by mouth every 8 (eight) hours as needed for nausea or vomiting.   Yes Historical Provider, MD  SUMAtriptan (IMITREX) 100 MG tablet Take 100 mg by mouth every 2 (two) hours as needed for migraine. May repeat in 2 hours if headache persists or recurs.   Yes Historical Provider, MD  sulfamethoxazole-trimethoprim (BACTRIM DS,SEPTRA DS) 800-160 MG tablet Take 1 tablet by mouth 2 (two) times daily. 11/12/15  Payton Mccallumrlando Dade Rodin, MD   Meds Ordered and Administered this Visit   Medications  ondansetron (ZOFRAN-ODT) disintegrating tablet 8 mg (8 mg Oral Given 11/12/15 1534)    BP 119/80 mmHg  Pulse 91  Temp(Src) 98 F (36.7 C) (Oral)  Resp 20  Ht 5\' 8"  (1.727 m)  Wt 166 lb (75.297 kg)  BMI 25.25 kg/m2  SpO2 100%  LMP 10/31/2015 No data found.   Physical Exam  Constitutional: She appears well-developed and well-nourished. No distress.  Abdominal: Soft. Bowel sounds are normal. She exhibits no distension and no mass. There is tenderness (mild suprapubic). There is no rebound and no guarding.  Skin: She is not diaphoretic.  Nursing note and  vitals reviewed.   ED Course  Procedures (including critical care time)  Labs Review Labs Reviewed  URINE CULTURE - Abnormal; Notable for the following:    Culture MULTIPLE SPECIES PRESENT, SUGGEST RECOLLECTION (*)    All other components within normal limits  URINALYSIS COMPLETEWITH MICROSCOPIC ([facility] ONLY) - Abnormal; Notable for the following:    Ketones, ur TRACE (*)    Bacteria, UA FEW (*)    Squamous Epithelial / LPF 0-5 (*)    All other components within normal limits  PREGNANCY, URINE  TSH  HCG, QUANTITATIVE, PREGNANCY    Imaging Review No results found.   Visual Acuity Review  Right Eye Distance:   Left Eye Distance:   Bilateral Distance:    Right Eye Near:   Left Eye Near:    Bilateral Near:         MDM   1. Nausea and vomiting, vomiting of unspecified type   2. Dysuria    Discharge Medication List as of 11/12/2015  4:45 PM    START taking these medications   Details  sulfamethoxazole-trimethoprim (BACTRIM DS,SEPTRA DS) 800-160 MG tablet Take 1 tablet by mouth 2 (two) times daily., Starting 11/12/2015, Until Discontinued, Normal       1. Labs results and diagnosis reviewed with patient 2. rx as per orders above; reviewed possible side effects, interactions, risks and benefits  3. Recommend supportive treatment with increased fluids  4. Follow-up prn if symptoms worsen or don't improve    Payton Mccallumrlando Orah Sonnen, MD 12/18/15 1714

## 2015-12-24 ENCOUNTER — Ambulatory Visit: Payer: 59 | Admitting: Unknown Physician Specialty

## 2016-01-14 ENCOUNTER — Ambulatory Visit (INDEPENDENT_AMBULATORY_CARE_PROVIDER_SITE_OTHER): Payer: 59 | Admitting: Unknown Physician Specialty

## 2016-01-14 ENCOUNTER — Encounter: Payer: Self-pay | Admitting: Unknown Physician Specialty

## 2016-01-14 ENCOUNTER — Ambulatory Visit: Payer: 59 | Admitting: Unknown Physician Specialty

## 2016-01-14 VITALS — BP 111/73 | HR 69 | Temp 98.5°F | Ht 69.0 in | Wt 176.6 lb

## 2016-01-14 DIAGNOSIS — J4540 Moderate persistent asthma, uncomplicated: Secondary | ICD-10-CM | POA: Diagnosis not present

## 2016-01-14 MED ORDER — BECLOMETHASONE DIPROPIONATE 80 MCG/ACT IN AERS: 2.0000 | INHALATION_SPRAY | Freq: Every day | RESPIRATORY_TRACT | Status: DC

## 2016-01-14 NOTE — Assessment & Plan Note (Signed)
Pt with previous exercise induced asthma who is no having persistent problems.  Add steroid inhaler and recehck in 4 week.

## 2016-01-14 NOTE — Progress Notes (Signed)
   BP 111/73 mmHg  Pulse 69  Temp(Src) 98.5 F (36.9 C)  Ht 5\' 9"  (1.753 m)  Wt 176 lb 9.6 oz (80.105 kg)  BMI 26.07 kg/m2  SpO2 100%  LMP 10/31/2015 (Approximate)   Subjective:    Patient ID: Miranda Mcdonald, female    DOB: 12/20/1990, 25 y.o.   MRN: [id number]  HPI: Miranda Mcdonald is a 25 y.o. female  Chief Complaint  Patient presents with  . Asthma    pt states she has been having asthma flares for about the last month or two   ASTHMA Pt had stress and exercise asthma for a while.  Humidity is always a trigger and worse this year.  Pt gets SOB climbing stairs and running.  States she is gasping and can't get enough in.  Inhaler helps but does not make it go away.  She had been on Advair in past.   Night time symptoms: none but states there is a perpetual discomfort all the time.   Wheeze/SOB: yes ER visits since last visit Missed work or school:none  Relevant past medical, surgical, family and social history reviewed and updated as indicated. Interim medical history since our last visit reviewed. Allergies and medications reviewed and updated.  Review of Systems  Per HPI unless specifically indicated above     Objective:    BP 111/73 mmHg  Pulse 69  Temp(Src) 98.5 F (36.9 C)  Ht 5\' 9"  (1.753 m)  Wt 176 lb 9.6 oz (80.105 kg)  BMI 26.07 kg/m2  SpO2 100%  LMP 10/31/2015 (Approximate)  Wt Readings from Last 3 Encounters:  01/14/16 176 lb 9.6 oz (80.105 kg)  11/12/15 166 lb (75.297 kg)  05/04/15 166 lb (75.297 kg)    Physical Exam  Constitutional: She is oriented to person, place, and time. She appears well-developed and well-nourished. No distress.  HENT:  Head: Normocephalic and atraumatic.  Eyes: Conjunctivae and lids are normal. Right eye exhibits no discharge. Left eye exhibits no discharge. No scleral icterus.  Neck: Normal range of motion. Neck supple. No JVD present. Carotid bruit is not present.  Cardiovascular: Normal rate, regular rhythm and normal heart  sounds.   Pulmonary/Chest: Effort normal and breath sounds normal.  Abdominal: Normal appearance. There is no splenomegaly or hepatomegaly.  Musculoskeletal: Normal range of motion.  Neurological: She is alert and oriented to person, place, and time.  Skin: Skin is warm, dry and intact. No rash noted. No pallor.  Psychiatric: She has a normal mood and affect. Her behavior is normal. Judgment and thought content normal.   Assessment & Plan:   Problem List Items Addressed This Visit      Unprioritized   Asthma, moderate persistent - Primary   Relevant Medications   beclomethasone (QVAR) 80 MCG/ACT inhaler   Other Relevant Orders   Spirometry with Graph (Completed)       Follow up plan: Return in about 4 weeks (around 02/11/2016).

## 2016-02-22 ENCOUNTER — Encounter: Payer: Self-pay | Admitting: Unknown Physician Specialty

## 2016-02-22 ENCOUNTER — Ambulatory Visit (INDEPENDENT_AMBULATORY_CARE_PROVIDER_SITE_OTHER): Payer: 59 | Admitting: Unknown Physician Specialty

## 2016-02-22 DIAGNOSIS — J4540 Moderate persistent asthma, uncomplicated: Secondary | ICD-10-CM | POA: Diagnosis not present

## 2016-02-22 DIAGNOSIS — G43909 Migraine, unspecified, not intractable, without status migrainosus: Secondary | ICD-10-CM | POA: Insufficient documentation

## 2016-02-22 DIAGNOSIS — G43109 Migraine with aura, not intractable, without status migrainosus: Secondary | ICD-10-CM | POA: Diagnosis not present

## 2016-02-22 MED ORDER — SUMATRIPTAN SUCCINATE 100 MG PO TABS: 100.0000 mg | ORAL_TABLET | ORAL | 12 refills | Status: DC | PRN

## 2016-02-22 MED ORDER — KETOROLAC TROMETHAMINE 10 MG PO TABS: 10.0000 mg | ORAL_TABLET | Freq: Four times a day (QID) | ORAL | 3 refills | Status: DC | PRN

## 2016-02-22 NOTE — Assessment & Plan Note (Signed)
Stable, continue present medications.   

## 2016-02-22 NOTE — Assessment & Plan Note (Signed)
Rx to refill Toradol

## 2016-02-22 NOTE — Progress Notes (Signed)
BP 112/73 (BP Location: Left Arm, Patient Position: Sitting, Cuff Size: Normal)   Pulse 80   Temp 98.4 F (36.9 C)   Ht 5' 9.5" (1.765 m)   Wt 178 lb 6.4 oz (80.9 kg)   SpO2 98%   BMI 25.97 kg/m    Subjective:    Patient ID: Miranda Mcdonald, female    DOB: 10/04/1990, 25 y.o.   MRN: [id number]  HPI: Miranda Mcdonald is a 25 y.o. female  Chief Complaint  Patient presents with  . Asthma    4 week f/up  . Medication Refill    pt states she needs a refill on toradol   Asthma States she is doing "a lot better" with current asthma medication.  She is having some tooth pain related to sinus pressure.  Antihistamines are helping.  She has only used her rescue inhaler once.    Migraine Takes Toradol "one in a blue moon."  Trigger is Aspartane.     Relevant past medical, surgical, family and social history reviewed and updated as indicated. Interim medical history since our last visit reviewed. Allergies and medications reviewed and updated.  Review of Systems  Per HPI unless specifically indicated above     Objective:    BP 112/73 (BP Location: Left Arm, Patient Position: Sitting, Cuff Size: Normal)   Pulse 80   Temp 98.4 F (36.9 C)   Ht 5' 9.5" (1.765 m)   Wt 178 lb 6.4 oz (80.9 kg)   SpO2 98%   BMI 25.97 kg/m   Wt Readings from Last 3 Encounters:  02/22/16 178 lb 6.4 oz (80.9 kg)  01/14/16 176 lb 9.6 oz (80.1 kg)  11/12/15 166 lb (75.3 kg)    Physical Exam  Constitutional: She is oriented to person, place, and time. She appears well-developed and well-nourished. No distress.  HENT:  Head: Normocephalic and atraumatic.  Eyes: Conjunctivae and lids are normal. Right eye exhibits no discharge. Left eye exhibits no discharge. No scleral icterus.  Neck: Normal range of motion. Neck supple. No JVD present. Carotid bruit is not present.  Cardiovascular: Normal rate, regular rhythm and normal heart sounds.   Pulmonary/Chest: Effort normal and breath sounds normal.    Abdominal: Normal appearance. There is no splenomegaly or hepatomegaly.  Musculoskeletal: Normal range of motion.  Neurological: She is alert and oriented to person, place, and time.  Skin: Skin is warm, dry and intact. No rash noted. No pallor.  Psychiatric: She has a normal mood and affect. Her behavior is normal. Judgment and thought content normal.    Results for orders placed or performed during the hospital encounter of 11/12/15  Urine culture  Result Value Ref Range   Specimen Description URINE, CLEAN CATCH    Special Requests Normal    Culture MULTIPLE SPECIES PRESENT, SUGGEST RECOLLECTION (A)    Report Status 11/14/2015 FINAL   Pregnancy, urine  Result Value Ref Range   Preg Test, Ur NEGATIVE NEGATIVE  Urinalysis complete, with microscopic  Result Value Ref Range   Color, Urine YELLOW YELLOW   APPearance CLEAR CLEAR   Glucose, UA NEGATIVE NEGATIVE mg/dL   Bilirubin Urine NEGATIVE NEGATIVE   Ketones, ur TRACE (A) NEGATIVE mg/dL   Specific Gravity, Urine 1.020 1.005 - 1.030   Hgb urine dipstick NEGATIVE NEGATIVE   pH 6.5 5.0 - 8.0   Protein, ur NEGATIVE NEGATIVE mg/dL   Nitrite NEGATIVE NEGATIVE   Leukocytes, UA NEGATIVE NEGATIVE   RBC / HPF 0-5 0 - 5 RBC/hpf  WBC, UA 0-5 0 - 5 WBC/hpf   Bacteria, UA FEW (A) NONE SEEN   Squamous Epithelial / LPF 0-5 (A) NONE SEEN   Mucous PRESENT   TSH  Result Value Ref Range   TSH 1.308 0.350 - 4.500 uIU/mL  hCG, quantitative, pregnancy  Result Value Ref Range   hCG, Beta Chain, Quant, S 1 <5 mIU/mL      Assessment & Plan:   Problem List Items Addressed This Visit      Unprioritized   Asthma, moderate persistent    Stable, continue present medications.        Migraine    Rx to refill Toradol      Relevant Medications   ketorolac (TORADOL) 10 MG tablet   SUMAtriptan (IMITREX) 100 MG tablet    Other Visit Diagnoses   None.      Follow up plan: Return in about 1 year (around 02/21/2017).

## 2016-04-30 ENCOUNTER — Ambulatory Visit
Admission: RE | Admit: 2016-04-30 | Discharge: 2016-04-30 | Disposition: A | Discharge status: Home / Self Care | Payer: PRIVATE HEALTH INSURANCE | Source: Ambulatory Visit | Attending: Family | Admitting: Family

## 2016-04-30 ENCOUNTER — Other Ambulatory Visit: Payer: Self-pay | Admitting: Family

## 2016-04-30 ENCOUNTER — Ambulatory Visit: Payer: Self-pay | Admitting: Physician Assistant

## 2016-04-30 DIAGNOSIS — W231XXA Caught, crushed, jammed, or pinched between stationary objects, initial encounter: Secondary | ICD-10-CM | POA: Insufficient documentation

## 2016-04-30 DIAGNOSIS — S62665A Nondisplaced fracture of distal phalanx of left ring finger, initial encounter for closed fracture: Secondary | ICD-10-CM | POA: Diagnosis not present

## 2016-04-30 DIAGNOSIS — R52 Pain, unspecified: Secondary | ICD-10-CM

## 2016-05-02 ENCOUNTER — Other Ambulatory Visit: Payer: Self-pay | Admitting: Orthopedic Surgery

## 2016-05-02 DIAGNOSIS — M79645 Pain in left finger(s): Secondary | ICD-10-CM

## 2016-05-02 DIAGNOSIS — S62609B Fracture of unspecified phalanx of unspecified finger, initial encounter for open fracture: Secondary | ICD-10-CM | POA: Insufficient documentation

## 2016-05-03 DIAGNOSIS — M25642 Stiffness of left hand, not elsewhere classified: Secondary | ICD-10-CM | POA: Insufficient documentation

## 2016-05-05 ENCOUNTER — Ambulatory Visit
Admission: RE | Admit: 2016-05-05 | Discharge: 2016-05-05 | Disposition: A | Discharge status: Home / Self Care | Payer: Worker's Compensation | Source: Ambulatory Visit | Attending: Orthopedic Surgery | Admitting: Orthopedic Surgery

## 2016-05-05 DIAGNOSIS — M79645 Pain in left finger(s): Secondary | ICD-10-CM

## 2016-06-02 DIAGNOSIS — M79645 Pain in left finger(s): Secondary | ICD-10-CM | POA: Insufficient documentation

## 2016-06-03 ENCOUNTER — Ambulatory Visit (INDEPENDENT_AMBULATORY_CARE_PROVIDER_SITE_OTHER): Payer: 59 | Admitting: Unknown Physician Specialty

## 2016-06-03 ENCOUNTER — Encounter: Payer: Self-pay | Admitting: Unknown Physician Specialty

## 2016-06-03 VITALS — BP 128/80 | HR 89 | Temp 98.5°F | Resp 14 | Ht 68.9 in | Wt 179.8 lb

## 2016-06-03 DIAGNOSIS — Z0000 Encounter for general adult medical examination without abnormal findings: Secondary | ICD-10-CM

## 2016-06-03 NOTE — Progress Notes (Signed)
BP 128/80 (BP Location: Left Arm, Patient Position: Sitting, Cuff Size: Normal)   Pulse 89   Temp 98.5 F (36.9 C)   Resp 14   Ht 5' 8.9" (1.75 m)   Wt 179 lb 12.8 oz (81.6 kg)   SpO2 98%   BMI 26.63 kg/m    Subjective:    Patient ID: Miranda Mcdonald, female    DOB: 10/21/1990, 25 y.o.   MRN: [id number]  HPI: Miranda Mcdonald is a 25 y.o. female  Chief Complaint  Patient presents with  . Paperwork   Physical for school  Relevant past medical, surgical, family and social history reviewed and updated as indicated. Interim medical history since our last visit reviewed. Allergies and medications reviewed and updated.  Review of Systems  Constitutional: Negative.   HENT: Negative.   Eyes: Negative.   Respiratory: Negative.   Cardiovascular: Negative.   Gastrointestinal: Negative.   Endocrine: Negative.   Genitourinary: Negative.   Musculoskeletal: Negative.   Skin: Negative.   Allergic/Immunologic: Negative.   Neurological: Negative.   Hematological: Negative.   Psychiatric/Behavioral: Negative.     Per HPI unless specifically indicated above     Objective:    BP 128/80 (BP Location: Left Arm, Patient Position: Sitting, Cuff Size: Normal)   Pulse 89   Temp 98.5 F (36.9 C)   Resp 14   Ht 5' 8.9" (1.75 m)   Wt 179 lb 12.8 oz (81.6 kg)   SpO2 98%   BMI 26.63 kg/m   Wt Readings from Last 3 Encounters:  06/03/16 179 lb 12.8 oz (81.6 kg)  02/22/16 178 lb 6.4 oz (80.9 kg)  01/14/16 176 lb 9.6 oz (80.1 kg)    Physical Exam  Constitutional: She is oriented to person, place, and time. She appears well-developed and well-nourished. No distress.  HENT:  Head: Normocephalic and atraumatic.  Eyes: Conjunctivae and lids are normal. Right eye exhibits no discharge. Left eye exhibits no discharge. No scleral icterus.  Neck: Normal range of motion. Neck supple. No JVD present. Carotid bruit is not present.  Cardiovascular: Normal rate, regular rhythm and normal heart  sounds.   Pulmonary/Chest: Effort normal and breath sounds normal.  Abdominal: Normal appearance. There is no splenomegaly or hepatomegaly.  Musculoskeletal: Normal range of motion.  Neurological: She is alert and oriented to person, place, and time.  Skin: Skin is warm, dry and intact. No rash noted. No pallor.  Psychiatric: She has a normal mood and affect. Her behavior is normal. Judgment and thought content normal.    Results for orders placed or performed during the hospital encounter of 11/12/15  Urine culture  Result Value Ref Range   Specimen Description URINE, CLEAN CATCH    Special Requests Normal    Culture MULTIPLE SPECIES PRESENT, SUGGEST RECOLLECTION (A)    Report Status 11/14/2015 FINAL   Pregnancy, urine  Result Value Ref Range   Preg Test, Ur NEGATIVE NEGATIVE  Urinalysis complete, with microscopic  Result Value Ref Range   Color, Urine YELLOW YELLOW   APPearance CLEAR CLEAR   Glucose, UA NEGATIVE NEGATIVE mg/dL   Bilirubin Urine NEGATIVE NEGATIVE   Ketones, ur TRACE (A) NEGATIVE mg/dL   Specific Gravity, Urine 1.020 1.005 - 1.030   Hgb urine dipstick NEGATIVE NEGATIVE   pH 6.5 5.0 - 8.0   Protein, ur NEGATIVE NEGATIVE mg/dL   Nitrite NEGATIVE NEGATIVE   Leukocytes, UA NEGATIVE NEGATIVE   RBC / HPF 0-5 0 - 5 RBC/hpf   WBC, UA 0-5  0 - 5 WBC/hpf   Bacteria, UA FEW (A) NONE SEEN   Squamous Epithelial / LPF 0-5 (A) NONE SEEN   Mucous PRESENT   TSH  Result Value Ref Range   TSH 1.308 0.350 - 4.500 uIU/mL  hCG, quantitative, pregnancy  Result Value Ref Range   hCG, Beta Chain, Quant, S 1 <5 mIU/mL      Assessment & Plan:   Problem List Items Addressed This Visit    None    Visit Diagnoses    Routine general medical examination at a health care facility    -  Primary   Relevant Orders   UA/M w/rflx Culture, Routine       Follow up plan: No Follow-up on file.

## 2016-06-05 LAB — UA/M W/RFLX CULTURE, ROUTINE
Bilirubin, UA: NEGATIVE
Glucose, UA: NEGATIVE
Ketones, UA: NEGATIVE
LEUKOCYTES UA: NEGATIVE
Nitrite, UA: NEGATIVE
PH UA: 7.5 (ref 5.0–7.5)
PROTEIN UA: NEGATIVE
Specific Gravity, UA: 1.015 (ref 1.005–1.030)
Urobilinogen, Ur: 0.2 mg/dL (ref 0.2–1.0)

## 2016-06-05 LAB — MICROSCOPIC EXAMINATION

## 2016-06-05 LAB — URINE CULTURE, REFLEX

## 2016-07-14 DIAGNOSIS — S62665G Nondisplaced fracture of distal phalanx of left ring finger, subsequent encounter for fracture with delayed healing: Secondary | ICD-10-CM | POA: Insufficient documentation

## 2016-10-15 ENCOUNTER — Encounter: Payer: Self-pay | Admitting: Unknown Physician Specialty

## 2016-10-15 ENCOUNTER — Ambulatory Visit (INDEPENDENT_AMBULATORY_CARE_PROVIDER_SITE_OTHER): Payer: 59 | Admitting: Unknown Physician Specialty

## 2016-10-15 ENCOUNTER — Telehealth: Payer: Self-pay | Admitting: Unknown Physician Specialty

## 2016-10-15 VITALS — BP 120/81 | HR 91 | Temp 98.2°F | Wt 184.6 lb

## 2016-10-15 DIAGNOSIS — J0110 Acute frontal sinusitis, unspecified: Secondary | ICD-10-CM | POA: Diagnosis not present

## 2016-10-15 DIAGNOSIS — R52 Pain, unspecified: Secondary | ICD-10-CM | POA: Diagnosis not present

## 2016-10-15 DIAGNOSIS — G43109 Migraine with aura, not intractable, without status migrainosus: Secondary | ICD-10-CM | POA: Diagnosis not present

## 2016-10-15 LAB — VERITOR FLU A/B WAIVED
INFLUENZA A: NEGATIVE
INFLUENZA B: NEGATIVE

## 2016-10-15 MED ORDER — PROMETHAZINE HCL 25 MG PO TABS: 25.0000 mg | ORAL_TABLET | Freq: Three times a day (TID) | ORAL | 0 refills | Status: DC | PRN

## 2016-10-15 MED ORDER — AMOXICILLIN-POT CLAVULANATE 875-125 MG PO TABS: 1.0000 | ORAL_TABLET | Freq: Two times a day (BID) | ORAL | 0 refills | Status: DC

## 2016-10-15 NOTE — Telephone Encounter (Signed)
Routing to provider  

## 2016-10-15 NOTE — Telephone Encounter (Signed)
Called and left patient a VM letting her know that her medication has been sent in for her. Apologized to her as well.

## 2016-10-15 NOTE — Progress Notes (Signed)
BP 120/81 (BP Location: Left Arm, Patient Position: Sitting, Cuff Size: Normal)   Pulse 91   Temp 98.2 F (36.8 C)   Wt 184 lb 9.6 oz (83.7 kg)   SpO2 97%   BMI 27.34 kg/m    Subjective:    Patient ID: Miranda Mcdonald, female    DOB: Jan 02, 1991, 26 y.o.   MRN: [id number]  HPI: Miranda Mcdonald is a 26 y.o. female  Chief Complaint  Patient presents with  . URI    pt states she has a headache, ear pain, tooth pain, congestion, body aches and sinus pressure. States symptoms started yesterday morning.    URI   This is a new problem. The current episode started yesterday. The problem has been unchanged. There has been no fever. Associated symptoms include congestion, ear pain, headaches, nausea, sinus pain and swollen glands. She has tried decongestant and NSAIDs (Imitrex and Toradol) for the symptoms. The treatment provided mild relief.     Relevant past medical, surgical, family and social history reviewed and updated as indicated. Interim medical history since our last visit reviewed. Allergies and medications reviewed and updated.  Review of Systems  HENT: Positive for congestion, ear pain and sinus pain.   Gastrointestinal: Positive for nausea.  Neurological: Positive for dizziness and headaches. Negative for tremors, syncope, speech difficulty, weakness and numbness.    Per HPI unless specifically indicated above     Objective:    BP 120/81 (BP Location: Left Arm, Patient Position: Sitting, Cuff Size: Normal)   Pulse 91   Temp 98.2 F (36.8 C)   Wt 184 lb 9.6 oz (83.7 kg)   SpO2 97%   BMI 27.34 kg/m   Wt Readings from Last 3 Encounters:  10/15/16 184 lb 9.6 oz (83.7 kg)  06/03/16 179 lb 12.8 oz (81.6 kg)  02/22/16 178 lb 6.4 oz (80.9 kg)    Physical Exam  Constitutional: She is oriented to person, place, and time. She appears well-developed and well-nourished. No distress.  HENT:  Head: Normocephalic and atraumatic.  Right Ear: Tympanic membrane and ear canal  normal.  Left Ear: Tympanic membrane and ear canal normal.  Nose: Rhinorrhea and sinus tenderness present. Right sinus exhibits maxillary sinus tenderness and frontal sinus tenderness. Left sinus exhibits no maxillary sinus tenderness and no frontal sinus tenderness.  Mouth/Throat: Mucous membranes are normal. Posterior oropharyngeal erythema present.  Eyes: Conjunctivae and lids are normal. Right eye exhibits no discharge. Left eye exhibits no discharge. No scleral icterus.  Cardiovascular: Normal rate and regular rhythm.   Pulmonary/Chest: Effort normal and breath sounds normal. No respiratory distress.  Abdominal: Normal appearance. There is no splenomegaly or hepatomegaly.  Musculoskeletal: Normal range of motion.  Neurological: She is alert and oriented to person, place, and time.  Skin: Skin is intact. No rash noted. No pallor.  Psychiatric: She has a normal mood and affect. Her behavior is normal. Judgment and thought content normal.      Assessment & Plan:   Problem List Items Addressed This Visit      Unprioritized   Migraine    Migraine vs Sinusiits though migraine meds not working.  Will add Phenergan.         Other Visit Diagnoses    Body aches    -  Primary   Relevant Orders   Veritor Flu A/B Waived   Acute non-recurrent frontal sinusitis       Treat for sinusitis with right sided tenderness   Relevant Medications  promethazine (PHENERGAN) 25 MG tablet      -+--------------------+ Follow up plan: Return if symptoms worsen or fail to improve.

## 2016-10-15 NOTE — Telephone Encounter (Signed)
Patient said she thought Miranda Mcdonald was going to call her in Augmentin but it was not at the pharmacy.  She would like for [place] to call her if we call this in.  Thanks

## 2016-10-15 NOTE — Assessment & Plan Note (Signed)
Migraine vs Sinusiits though migraine meds not working.  Will add Phenergan.

## 2016-10-15 NOTE — Telephone Encounter (Signed)
Sorry.  Went in

## 2016-10-17 ENCOUNTER — Encounter: Payer: Self-pay | Admitting: Unknown Physician Specialty

## 2016-10-17 MED ORDER — NORETHINDRONE 0.35 MG PO TABS: 1.0000 | ORAL_TABLET | Freq: Every day | ORAL | 11 refills | Status: DC

## 2016-12-11 ENCOUNTER — Encounter: Payer: Self-pay | Admitting: Certified Nurse Midwife

## 2016-12-11 ENCOUNTER — Ambulatory Visit (INDEPENDENT_AMBULATORY_CARE_PROVIDER_SITE_OTHER): Payer: 59 | Admitting: Certified Nurse Midwife

## 2016-12-11 VITALS — BP 107/71 | HR 78 | Ht 69.0 in | Wt 188.9 lb

## 2016-12-11 DIAGNOSIS — Z6827 Body mass index (BMI) 27.0-27.9, adult: Secondary | ICD-10-CM | POA: Diagnosis not present

## 2016-12-11 DIAGNOSIS — Z124 Encounter for screening for malignant neoplasm of cervix: Secondary | ICD-10-CM

## 2016-12-11 DIAGNOSIS — Z01419 Encounter for gynecological examination (general) (routine) without abnormal findings: Secondary | ICD-10-CM

## 2016-12-11 LAB — HM PAP SMEAR: HM Pap smear: NEGATIVE

## 2016-12-11 NOTE — Patient Instructions (Addendum)
Preventive Care 18-39 Years, Female Preventive care refers to lifestyle choices and visits with your health care provider that can promote health and wellness. What does preventive care include?  A yearly physical exam. This is also called an annual well check.  Dental exams once or twice a year.  Routine eye exams. Ask your health care provider how often you should have your eyes checked.  Personal lifestyle choices, including: ? Daily care of your teeth and gums. ? Regular physical activity. ? Eating a healthy diet. ? Avoiding tobacco and drug use. ? Limiting alcohol use. ? Practicing safe sex. ? Taking vitamin and mineral supplements as recommended by your health care provider. What happens during an annual well check? The services and screenings done by your health care provider during your annual well check will depend on your age, overall health, lifestyle risk factors, and family history of disease. Counseling Your health care provider may ask you questions about your:  Alcohol use.  Tobacco use.  Drug use.  Emotional well-being.  Home and relationship well-being.  Sexual activity.  Eating habits.  Work and work Statistician.  Method of birth control.  Menstrual cycle.  Pregnancy history.  Screening You may have the following tests or measurements:  Height, weight, and BMI.  Diabetes screening. This is done by checking your blood sugar (glucose) after you have not eaten for a while (fasting).  Blood pressure.  Lipid and cholesterol levels. These may be checked every 5 years starting at age 66.  Skin check.  Hepatitis C blood test.  Hepatitis B blood test.  Sexually transmitted disease (STD) testing.  BRCA-related cancer screening. This may be done if you have a family history of breast, ovarian, tubal, or peritoneal cancers.  Pelvic exam and Pap test. This may be done every 3 years starting at age 40. Starting at age 59, this may be done every 5  years if you have a Pap test in combination with an HPV test.  Discuss your test results, treatment options, and if necessary, the need for more tests with your health care provider. Vaccines Your health care provider may recommend certain vaccines, such as:  Influenza vaccine. This is recommended every year.  Tetanus, diphtheria, and acellular pertussis (Tdap, Td) vaccine. You may need a Td booster every 10 years.  Varicella vaccine. You may need this if you have not been vaccinated.  HPV vaccine. If you are 69 or younger, you may need three doses over 6 months.  Measles, mumps, and rubella (MMR) vaccine. You may need at least one dose of MMR. You may also need a second dose.  Pneumococcal 13-valent conjugate (PCV13) vaccine. You may need this if you have certain conditions and were not previously vaccinated.  Pneumococcal polysaccharide (PPSV23) vaccine. You may need one or two doses if you smoke cigarettes or if you have certain conditions.  Meningococcal vaccine. One dose is recommended if you are age 27-21 years and a first-year college student living in a residence hall, or if you have one of several medical conditions. You may also need additional booster doses.  Hepatitis A vaccine. You may need this if you have certain conditions or if you travel or work in places where you may be exposed to hepatitis A.  Hepatitis B vaccine. You may need this if you have certain conditions or if you travel or work in places where you may be exposed to hepatitis B.  Haemophilus influenzae type b (Hib) vaccine. You may need this if  you have certain risk factors.  Talk to your health care provider about which screenings and vaccines you need and how often you need them. This information is not intended to replace advice given to you by your health care provider. Make sure you discuss any questions you have with your health care provider. Document Released: 08/19/2001 Document Revised: 03/12/2016  Document Reviewed: 04/24/2015 Elsevier Interactive Patient Education  2017 Elsevier Inc.  Breast Tenderness Breast tenderness is a common problem for women of all ages. Breast tenderness may cause mild discomfort to severe pain. The pain usually comes and goes in association with your menstrual cycle, but it can be constant. Breast tenderness has many possible causes, including hormone changes and some medicines. Your health care provider may order tests, such as a mammogram or an ultrasound, to check for any unusual findings. Having breast tenderness usually does not mean that you have breast cancer. Follow these instructions at home: Sometimes, reassurance that you do not have breast cancer is all that is needed. In general, follow these home care instructions: Managing pain and discomfort  If directed, apply ice to the area: ? Put ice in a plastic bag. ? Place a towel between your skin and the bag. ? Leave the ice on for 20 minutes, 2-3 times a day.  Make sure you are wearing a supportive bra, especially during exercise. You may also want to wear a supportive bra while sleeping if your breasts are very tender. Medicines  Take over-the-counter and prescription medicines only as told by your health care provider. If the cause of your pain is infection, you may be prescribed an antibiotic medicine.  If you were prescribed an antibiotic, take it as told by your health care provider. Do not stop taking the antibiotic even if you start to feel better. General instructions  Your health care provider may recommend that you reduce the amount of fat in your diet. You can do this by: ? Limiting fried foods. ? Cooking foods using methods, such as baking, boiling, grilling, and broiling.  Decrease the amount of caffeine in your diet. You can do this by drinking more water and choosing caffeine-free options.  Keep a log of the days and times when your breasts are most tender.  Ask your health  care provider how to do breast exams at home. This will help you notice if you have an unusual growth or lump. Contact a health care provider if:  Any part of your breast is hard, red, and hot to the touch. This may be a sign of infection.  You are not breastfeeding and you have fluid, especially blood or pus, coming out of your nipples.  You have a fever.  You have a new or painful lump in your breast that remains after your menstrual period ends.  Your pain does not improve or it gets worse.  Your pain is interfering with your daily activities. This information is not intended to replace advice given to you by your health care provider. Make sure you discuss any questions you have with your health care provider. Document Released: 06/05/2008 Document Revised: 03/21/2016 Document Reviewed: 03/21/2016 Elsevier Interactive Patient Education  [employer].

## 2016-12-11 NOTE — Progress Notes (Signed)
ANNUAL PREVENTATIVE CARE GYN  ENCOUNTER NOTE  Subjective:       Miranda Mcdonald is a 26 y.o. G1P0 female here for a routine annual gynecologic exam.  Current complaints: breast pain and history of familial gynecologic cancers.   Denies difficulty breathing or respiratory distress, chest pain, abdominal pain, dysuria, dyspareunia, vaginal bleeding or change in discharge, and leg pain or welling.    Gynecologic History  Patient's last menstrual period was 11/04/2016 (approximate).  Contraception: oral progesterone-only contraceptive and Nexplanon  Last Pap: unsure. Results were: normal. History of abnormal.  Obstetric History  OB History  Gravida Para Term Preterm AB Living  1            SAB TAB Ectopic Multiple Live Births               # Outcome Date GA Lbr Len/2nd Weight Sex Delivery Anes PTL Lv  1 Gravida               Past Medical History:  Diagnosis Date  . Asthma   . Hypoglycemia   . Lactose intolerance in adult   . Migraines     Past Surgical History:  Procedure Laterality Date  . CLAVICLE SURGERY Left 2007  . CLAVICLE SURGERY     surgery to remove pin from first surgery  . KNEE SURGERY Left   . WISDOM TOOTH EXTRACTION      Current Outpatient Prescriptions on File Prior to Visit  Medication Sig Dispense Refill  . albuterol (PROVENTIL HFA;VENTOLIN HFA) 108 (90 BASE) MCG/ACT inhaler Inhale 2 puffs into the lungs every 6 (six) hours as needed for wheezing or shortness of breath. 1 Inhaler 1  . amoxicillin-clavulanate (AUGMENTIN) 875-125 MG tablet Take 1 tablet by mouth 2 (two) times daily. 20 tablet 0  . beclomethasone (QVAR) 80 MCG/ACT inhaler Inhale 2 puffs into the lungs daily. 1 Inhaler 12  . etonogestrel (NEXPLANON) 68 MG IMPL implant 1 each by Subdermal route once.    Marland Kitchen ketorolac (TORADOL) 10 MG tablet Take 1 tablet (10 mg total) by mouth every 6 (six) hours as needed. 20 tablet 3  . norethindrone (MICRONOR,CAMILA,ERRIN) 0.35 MG tablet Take 1 tablet (0.35 mg  total) by mouth daily. 1 Package 11  . ondansetron (ZOFRAN) 4 MG tablet Take 4 mg by mouth every 8 (eight) hours as needed for nausea or vomiting.    . promethazine (PHENERGAN) 25 MG tablet Take 1 tablet (25 mg total) by mouth every 8 (eight) hours as needed for nausea or vomiting. 20 tablet 0  . SUMAtriptan (IMITREX) 100 MG tablet Take 1 tablet (100 mg total) by mouth every 2 (two) hours as needed for migraine. May repeat in 2 hours if headache persists or recurs. 10 tablet 12   No current facility-administered medications on file prior to visit.     Allergies  Allergen Reactions  . Morphine And Related Other (See Comments)    Mean, angry, violent   . Percocet [Oxycodone-Acetaminophen] Other (See Comments)    Migraine    Social History   Social History  . Marital status: Married    Spouse name: N/A  . Number of children: N/A  . Years of education: N/A   Occupational History  . Not on file.   Social History Main Topics  . Smoking status: Never Smoker  . Smokeless tobacco: Never Used  . Alcohol use Yes     Comment: occasional- pt states once in a while  . Drug use: No  .  Sexual activity: Yes    Birth control/ protection: Implant   Other Topics Concern  . Not on file   Social History Narrative  . No narrative on file    Family History  Problem Relation Age of Onset  . Bipolar disorder Mother   . Hypothyroidism Mother   . Restless legs syndrome Mother   . Neuropathy Father   . Depression Father   . Bipolar disorder Sister   . Hypothyroidism Sister   . Heart murmur Daughter   . Speech disorder Daughter   . Arthritis Maternal Grandmother   . Hypotension Maternal Grandmother   . Diabetes Maternal Grandfather   . Depression Maternal Grandfather   . Hyperlipidemia Maternal Grandfather   . Liver disease Maternal Grandfather        fatty liver  . Bipolar disorder Maternal Grandfather   . Hypothyroidism Maternal Grandfather   . Hypertension Paternal Grandmother    . Depression Paternal Grandmother   . Depression Paternal Grandfather     The following portions of the patient's history were reviewed and updated as appropriate: allergies, current medications, past family history, past medical history, past social history, past surgical history and problem list.  Review of Systems  ROS negative expect as noted above. Information obtained from patient.    Objective:   BP 107/71   Pulse 78   Ht 5\' 9"  (1.753 m)   Wt 188 lb 14.4 oz (85.7 kg)   LMP 11/04/2016 (Approximate) Comment: hx irregular menses  BMI 27.90 kg/m   CONSTITUTIONAL: Well-developed, well-nourished female in no acute distress.   PSYCHIATRIC: Normal mood and affect. Normal behavior. Normal judgment and thought content.  NEUROLGIC: Alert and oriented to person, place, and time. Normal muscle tone coordination. No cranial nerve deficit noted.  HENT:  Normocephalic, atraumatic, External right and left ear normal. Oropharynx is clear and moist  EYES: Conjunctivae and EOM are normal. Pupils are equal, round, and reactive to light. No scleral icterus.   NECK: Normal range of motion, supple, no masses.  Normal thyroid.   SKIN: Skin is warm and dry. No rash noted. Not diaphoretic. No erythema. No pallor.  CARDIOVASCULAR: Normal heart rate noted, regular rhythm, no murmur.  RESPIRATORY: Clear to auscultation bilaterally. Effort and breath sounds normal, no problems with respiration noted.  BREASTS: Symmetric in size. No masses, skin changes,  nipple drainage, or lymphadenopathy.  ABDOMEN: Soft, normal bowel sounds, no distention noted.  No tenderness, rebound or guarding.   PELVIC:  External Genitalia: Normal  BUS: Normal  Vagina: Normal  Cervix: Normal  Uterus: Normal  Adnexa: Normal   MUSCULOSKELETAL: Normal range of motion. No tenderness.  No cyanosis, clubbing, or edema.  2+ distal pulses.  LYMPHATIC: No Axillary, Supraclavicular, or Inguinal Adenopathy.  Assessment:    Annual gynecologic examination 26 y.o.   Contraception: oral progesterone-only contraceptive, Nexplanon and planning vasectomy   Overweight   Problem List Items Addressed This Visit    None    Visit Diagnoses    Well woman exam with routine gynecological exam    -  Primary   Relevant Orders   Pap IG w/ reflex to HPV when ASC-U   BMI 27.0-27.9,adult       Screening for cervical cancer       Relevant Orders   Pap IG w/ reflex to HPV when ASC-U      Plan:   Pap: Pap, Reflex if ASCUS.  Labs: Lipid 1, TSH and Hemoglobin A1C, see orders.   Routine preventative  health maintenance measures emphasized: Exercise/Diet/Weight control, Alcohol/Substance use risks and Stress Management   Discussed genetic screening for heredity cancers due to extensive family history.   RTC for Nexplanon removal.  RTC for annual labs and genetic cancer screening, if desired.  RTC x 1 year for annual exam.    Gunnar Bulla, CNM

## 2016-12-13 LAB — PAP IG W/ RFLX HPV ASCU: PAP SMEAR COMMENT: 0

## 2017-02-24 ENCOUNTER — Encounter: Payer: 59 | Admitting: Unknown Physician Specialty

## 2017-03-04 ENCOUNTER — Ambulatory Visit (INDEPENDENT_AMBULATORY_CARE_PROVIDER_SITE_OTHER): Payer: 59 | Admitting: Unknown Physician Specialty

## 2017-03-04 ENCOUNTER — Encounter: Payer: Self-pay | Admitting: Unknown Physician Specialty

## 2017-03-04 VITALS — BP 118/75 | HR 88 | Temp 98.3°F | Ht 67.0 in | Wt 194.0 lb

## 2017-03-04 DIAGNOSIS — Z0000 Encounter for general adult medical examination without abnormal findings: Secondary | ICD-10-CM

## 2017-03-04 DIAGNOSIS — J4540 Moderate persistent asthma, uncomplicated: Secondary | ICD-10-CM

## 2017-03-04 MED ORDER — FLUTICASONE-SALMETEROL 100-50 MCG/DOSE IN AEPB: 1.0000 | INHALATION_SPRAY | Freq: Two times a day (BID) | RESPIRATORY_TRACT | 12 refills | Status: DC

## 2017-03-04 NOTE — Assessment & Plan Note (Signed)
Not doing as well with QVAR.  Will change to Advair.

## 2017-03-04 NOTE — Progress Notes (Signed)
BP 118/75   Pulse 88   Temp 98.3 F (36.8 C)   Ht 5\' 7"  (1.702 m)   Wt 194 lb (88 kg)   SpO2 98%   BMI 30.38 kg/m    Subjective:    Patient ID: Miranda Mcdonald, female    DOB: 08/16/1990, [age] y.o.   MRN: [id number]  HPI: Miranda Mcdonald is a 26 y.o. female  Chief Complaint  Patient presents with  . Annual Exam   COPD Feels symptoms are not well controlled and finds that her asthma seems to be worse.  She is starting to use her rescue inhaler more often Night time symptoms: ER visits since last visit:none Missed work or school::none Increased cough:yes Increased SOB: yes with stairs  Migraine One mild on since last here  Family History  Problem Relation Age of Onset  . Bipolar disorder Mother   . Hypothyroidism Mother   . Restless legs syndrome Mother   . Neuropathy Father   . Depression Father   . Bipolar disorder Sister   . Hypothyroidism Sister   . Bipolar disorder Brother   . Heart murmur Daughter   . Speech disorder Daughter   . Arthritis Maternal Grandmother   . Hypotension Maternal Grandmother   . Diabetes Maternal Grandfather   . Depression Maternal Grandfather   . Hyperlipidemia Maternal Grandfather   . Liver disease Maternal Grandfather        fatty liver  . Bipolar disorder Maternal Grandfather   . Hypothyroidism Maternal Grandfather   . Hypertension Paternal Grandmother   . Depression Paternal Grandmother   . Depression Paternal Grandfather   . Breast cancer Maternal Aunt   . Cancer Maternal Aunt        breast  . Breast cancer Other   . Breast cancer Other     Relevant past medical, surgical, family and social history reviewed and updated as indicated. Interim medical history since our last visit reviewed. Allergies and medications reviewed and updated.  Review of Systems  Per HPI unless specifically indicated above     Objective:    BP 118/75   Pulse 88   Temp 98.3 F (36.8 C)   Ht 5\' 7"  (1.702 m)   Wt 194 lb (88 kg)   SpO2 98%    BMI 30.38 kg/m   Wt Readings from Last 3 Encounters:  03/04/17 194 lb (88 kg)  12/11/16 188 lb 14.4 oz (85.7 kg)  10/15/16 184 lb 9.6 oz (83.7 kg)    Physical Exam  Constitutional: She is oriented to person, place, and time. She appears well-developed and well-nourished. No distress.  HENT:  Head: Normocephalic and atraumatic.  Eyes: Conjunctivae and lids are normal. Right eye exhibits no discharge. Left eye exhibits no discharge. No scleral icterus.  Neck: Normal range of motion. Neck supple. No JVD present. Carotid bruit is not present.  Cardiovascular: Normal rate, regular rhythm and normal heart sounds.   Pulmonary/Chest: Effort normal and breath sounds normal.  Abdominal: Soft. Normal appearance and bowel sounds are normal. There is no splenomegaly or hepatomegaly.  Musculoskeletal: Normal range of motion.  Neurological: She is alert and oriented to person, place, and time.  Skin: Skin is warm, dry and intact. No rash noted. No pallor.  Psychiatric: She has a normal mood and affect. Her behavior is normal. Judgment and thought content normal.      Assessment & Plan:   Problem List Items Addressed This Visit      Unprioritized   Asthma,  moderate persistent    Not doing as well with QVAR.  Will change to Advair.        Relevant Medications   Fluticasone-Salmeterol (ADVAIR) 100-50 MCG/DOSE AEPB    Other Visit Diagnoses    Annual physical exam    -  Primary   Relevant Orders   CBC with Differential/Platelet   TSH   Comprehensive metabolic panel   Lipid Panel w/o Chol/HDL Ratio       Follow up plan: Return in about 4 weeks (around 04/01/2017).

## 2017-03-05 LAB — COMPREHENSIVE METABOLIC PANEL
ALT: 10 IU/L (ref 0–32)
AST: 15 IU/L (ref 0–40)
Albumin/Globulin Ratio: 1.9 (ref 1.2–2.2)
Albumin: 4.4 g/dL (ref 3.5–5.5)
Alkaline Phosphatase: 51 IU/L (ref 39–117)
BUN/Creatinine Ratio: 14 (ref 9–23)
BUN: 11 mg/dL (ref 6–20)
Bilirubin Total: 0.3 mg/dL (ref 0.0–1.2)
CALCIUM: 9.5 mg/dL (ref 8.7–10.2)
CHLORIDE: 103 mmol/L (ref 96–106)
CO2: 21 mmol/L (ref 20–29)
CREATININE: 0.76 mg/dL (ref 0.57–1.00)
GFR, EST AFRICAN AMERICAN: 124 mL/min/{1.73_m2} (ref >59)
GFR, EST NON AFRICAN AMERICAN: 108 mL/min/{1.73_m2} (ref >59)
GLUCOSE: 87 mg/dL (ref 65–99)
Globulin, Total: 2.3 g/dL (ref 1.5–4.5)
Potassium: 4 mmol/L (ref 3.5–5.2)
Sodium: 139 mmol/L (ref 134–144)
Total Protein: 6.7 g/dL (ref 6.0–8.5)

## 2017-03-05 LAB — CBC WITH DIFFERENTIAL/PLATELET
BASOS ABS: 0 10*3/uL (ref 0.0–0.2)
BASOS: 0 %
EOS (ABSOLUTE): 0.1 10*3/uL (ref 0.0–0.4)
EOS: 1 %
HEMATOCRIT: 39.8 % (ref 34.0–46.6)
HEMOGLOBIN: 13.1 g/dL (ref 11.1–15.9)
IMMATURE GRANS (ABS): 0 10*3/uL (ref 0.0–0.1)
Immature Granulocytes: 0 %
LYMPHS: 22 %
Lymphocytes Absolute: 1.5 10*3/uL (ref 0.7–3.1)
MCH: 28.6 pg (ref 26.6–33.0)
MCHC: 32.9 g/dL (ref 31.5–35.7)
MCV: 87 fL (ref 79–97)
Monocytes Absolute: 0.4 10*3/uL (ref 0.1–0.9)
Monocytes: 6 %
NEUTROS ABS: 4.8 10*3/uL (ref 1.4–7.0)
NEUTROS PCT: 71 %
Platelets: 240 10*3/uL (ref 150–379)
RBC: 4.58 x10E6/uL (ref 3.77–5.28)
RDW: 13 % (ref 12.3–15.4)
WBC: 6.8 10*3/uL (ref 3.4–10.8)

## 2017-03-05 LAB — LIPID PANEL W/O CHOL/HDL RATIO
Cholesterol, Total: 176 mg/dL (ref 100–199)
HDL: 50 mg/dL (ref >39)
LDL CALC: 111 mg/dL — AB (ref 0–99)
Triglycerides: 77 mg/dL (ref 0–149)
VLDL CHOLESTEROL CAL: 15 mg/dL (ref 5–40)

## 2017-03-05 LAB — TSH: TSH: 1.43 u[IU]/mL (ref 0.450–4.500)

## 2017-03-06 NOTE — Progress Notes (Signed)
Notified pt by mychart

## 2017-03-25 ENCOUNTER — Encounter: Payer: Self-pay | Admitting: Unknown Physician Specialty

## 2017-03-25 MED ORDER — KETOROLAC TROMETHAMINE 10 MG PO TABS: 10.0000 mg | ORAL_TABLET | Freq: Four times a day (QID) | ORAL | 3 refills | Status: DC | PRN

## 2017-03-31 ENCOUNTER — Encounter: Payer: Self-pay | Admitting: Unknown Physician Specialty

## 2017-03-31 ENCOUNTER — Ambulatory Visit (INDEPENDENT_AMBULATORY_CARE_PROVIDER_SITE_OTHER): Payer: 59 | Admitting: Unknown Physician Specialty

## 2017-03-31 DIAGNOSIS — J4540 Moderate persistent asthma, uncomplicated: Secondary | ICD-10-CM

## 2017-03-31 NOTE — Progress Notes (Signed)
BP 106/73   Pulse (!) 101   Temp 98.3 F (36.8 C)   Wt 198 lb 6.4 oz (90 kg)   SpO2 100%   BMI 31.07 kg/m    Subjective:    Patient ID: Miranda Mcdonald, female    DOB: 09-Apr-1991, 26 y.o.   MRN: [id number]  HPI: Miranda Mcdonald is a 26 y.o. female  Chief Complaint  Patient presents with  . Asthma    4 week f/up    ASTHMA Last visit stopped Advair.  She is running again and only stopping due to pain. Symptoms are well controlled Using medications without problems: Night time symptoms:none Wheeze/SOB:none ER visits since last visit:none Missed work or school:none Rare use of rescue inhaler    Relevant past medical, surgical, family and social history reviewed and updated as indicated. Interim medical history since our last visit reviewed. Allergies and medications reviewed and updated.  Review of Systems  Per HPI unless specifically indicated above     Objective:    BP 106/73   Pulse (!) 101   Temp 98.3 F (36.8 C)   Wt 198 lb 6.4 oz (90 kg)   SpO2 100%   BMI 31.07 kg/m   Wt Readings from Last 3 Encounters:  03/31/17 198 lb 6.4 oz (90 kg)  03/04/17 194 lb (88 kg)  12/11/16 188 lb 14.4 oz (85.7 kg)    Physical Exam  Constitutional: She is oriented to person, place, and time. She appears well-developed and well-nourished. No distress.  HENT:  Head: Normocephalic and atraumatic.  Eyes: Conjunctivae and lids are normal. Right eye exhibits no discharge. Left eye exhibits no discharge. No scleral icterus.  Neck: Normal range of motion. Neck supple. No JVD present. Carotid bruit is not present.  Cardiovascular: Normal rate, regular rhythm and normal heart sounds.   Pulmonary/Chest: Effort normal and breath sounds normal.  Abdominal: Normal appearance. There is no splenomegaly or hepatomegaly.  Musculoskeletal: Normal range of motion.  Neurological: She is alert and oriented to person, place, and time.  Skin: Skin is warm, dry and intact. No rash noted. No pallor.   Psychiatric: She has a normal mood and affect. Her behavior is normal. Judgment and thought content normal.    Results for orders placed or performed in visit on 03/04/17  CBC with Differential/Platelet  Result Value Ref Range   WBC 6.8 3.4 - 10.8 x10E3/uL   RBC 4.58 3.77 - 5.28 x10E6/uL   Hemoglobin 13.1 11.1 - 15.9 g/dL   Hematocrit 56.2 13.0 - 46.6 %   MCV 87 79 - 97 fL   MCH 28.6 26.6 - 33.0 pg   MCHC 32.9 31.5 - 35.7 g/dL   RDW 86.5 78.4 - 69.6 %   Platelets 240 150 - 379 x10E3/uL   Neutrophils 71 Not Estab. %   Lymphs 22 Not Estab. %   Monocytes 6 Not Estab. %   Eos 1 Not Estab. %   Basos 0 Not Estab. %   Neutrophils Absolute 4.8 1.4 - 7.0 x10E3/uL   Lymphocytes Absolute 1.5 0.7 - 3.1 x10E3/uL   Monocytes Absolute 0.4 0.1 - 0.9 x10E3/uL   EOS (ABSOLUTE) 0.1 0.0 - 0.4 x10E3/uL   Basophils Absolute 0.0 0.0 - 0.2 x10E3/uL   Immature Granulocytes 0 Not Estab. %   Immature Grans (Abs) 0.0 0.0 - 0.1 x10E3/uL  TSH  Result Value Ref Range   TSH 1.430 0.450 - 4.500 uIU/mL  Comprehensive metabolic panel  Result Value Ref Range   Glucose  87 65 - 99 mg/dL   BUN 11 6 - 20 mg/dL   Creatinine, Ser 1.61 0.57 - 1.00 mg/dL   GFR calc non Af Amer 108 >59 mL/min/1.73   GFR calc Af Amer 124 >59 mL/min/1.73   BUN/Creatinine Ratio 14 9 - 23   Sodium 139 134 - 144 mmol/L   Potassium 4.0 3.5 - 5.2 mmol/L   Chloride 103 96 - 106 mmol/L   CO2 21 20 - 29 mmol/L   Calcium 9.5 8.7 - 10.2 mg/dL   Total Protein 6.7 6.0 - 8.5 g/dL   Albumin 4.4 3.5 - 5.5 g/dL   Globulin, Total 2.3 1.5 - 4.5 g/dL   Albumin/Globulin Ratio 1.9 1.2 - 2.2   Bilirubin Total 0.3 0.0 - 1.2 mg/dL   Alkaline Phosphatase 51 39 - 117 IU/L   AST 15 0 - 40 IU/L   ALT 10 0 - 32 IU/L  Lipid Panel w/o Chol/HDL Ratio  Result Value Ref Range   Cholesterol, Total 176 100 - 199 mg/dL   Triglycerides 77 0 - 149 mg/dL   HDL 50 >09 mg/dL   VLDL Cholesterol Cal 15 5 - 40 mg/dL   LDL Calculated 604 (H) 0 - 99 mg/dL        Assessment & Plan:   Problem List Items Addressed This Visit      Unprioritized   Asthma, moderate persistent    Continue Advair at 250 mg.  Use Albuterol prn.            Follow up plan: Return if symptoms worsen or fail to improve.

## 2017-03-31 NOTE — Assessment & Plan Note (Signed)
Continue Advair at 250 mg.  Use Albuterol prn.

## 2017-08-25 ENCOUNTER — Ambulatory Visit (INDEPENDENT_AMBULATORY_CARE_PROVIDER_SITE_OTHER): Payer: 59 | Admitting: Certified Nurse Midwife

## 2017-08-25 VITALS — BP 115/67 | HR 91 | Ht 68.0 in | Wt 203.6 lb

## 2017-08-25 DIAGNOSIS — Z3046 Encounter for surveillance of implantable subdermal contraceptive: Secondary | ICD-10-CM | POA: Diagnosis not present

## 2017-08-25 NOTE — Patient Instructions (Signed)
Preventive Care 18-39 Years, Female Preventive care refers to lifestyle choices and visits with your health care provider that can promote health and wellness. What does preventive care include?  A yearly physical exam. This is also called an annual well check.  Dental exams once or twice a year.  Routine eye exams. Ask your health care provider how often you should have your eyes checked.  Personal lifestyle choices, including: ? Daily care of your teeth and gums. ? Regular physical activity. ? Eating a healthy diet. ? Avoiding tobacco and drug use. ? Limiting alcohol use. ? Practicing safe sex. ? Taking vitamin and mineral supplements as recommended by your health care provider. What happens during an annual well check? The services and screenings done by your health care provider during your annual well check will depend on your age, overall health, lifestyle risk factors, and family history of disease. Counseling Your health care provider may ask you questions about your:  Alcohol use.  Tobacco use.  Drug use.  Emotional well-being.  Home and relationship well-being.  Sexual activity.  Eating habits.  Work and work Statistician.  Method of birth control.  Menstrual cycle.  Pregnancy history.  Screening You may have the following tests or measurements:  Height, weight, and BMI.  Diabetes screening. This is done by checking your blood sugar (glucose) after you have not eaten for a while (fasting).  Blood pressure.  Lipid and cholesterol levels. These may be checked every 5 years starting at age 87.  Skin check.  Hepatitis C blood test.  Hepatitis B blood test.  Sexually transmitted disease (STD) testing.  BRCA-related cancer screening. This may be done if you have a family history of breast, ovarian, tubal, or peritoneal cancers.  Pelvic exam and Pap test. This may be done every 3 years starting at age 94. Starting at age 64, this may be done every 5  years if you have a Pap test in combination with an HPV test.  Discuss your test results, treatment options, and if necessary, the need for more tests with your health care provider. Vaccines Your health care provider may recommend certain vaccines, such as:  Influenza vaccine. This is recommended every year.  Tetanus, diphtheria, and acellular pertussis (Tdap, Td) vaccine. You may need a Td booster every 10 years.  Varicella vaccine. You may need this if you have not been vaccinated.  HPV vaccine. If you are 57 or younger, you may need three doses over 6 months.  Measles, mumps, and rubella (MMR) vaccine. You may need at least one dose of MMR. You may also need a second dose.  Pneumococcal 13-valent conjugate (PCV13) vaccine. You may need this if you have certain conditions and were not previously vaccinated.  Pneumococcal polysaccharide (PPSV23) vaccine. You may need one or two doses if you smoke cigarettes or if you have certain conditions.  Meningococcal vaccine. One dose is recommended if you are age 58-21 years and a first-year college student living in a residence hall, or if you have one of several medical conditions. You may also need additional booster doses.  Hepatitis A vaccine. You may need this if you have certain conditions or if you travel or work in places where you may be exposed to hepatitis A.  Hepatitis B vaccine. You may need this if you have certain conditions or if you travel or work in places where you may be exposed to hepatitis B.  Haemophilus influenzae type b (Hib) vaccine. You may need this if  you have certain risk factors.  Talk to your health care provider about which screenings and vaccines you need and how often you need them. This information is not intended to replace advice given to you by your health care provider. Make sure you discuss any questions you have with your health care provider. Document Released: 08/19/2001 Document Revised: 03/12/2016  Document Reviewed: 04/24/2015 Elsevier Interactive Patient Education  [employer].

## 2017-08-25 NOTE — Progress Notes (Signed)
Pt is here for removal of Nexplanon. Also spots after intercourse.

## 2017-08-25 NOTE — Progress Notes (Signed)
Miranda Mcdonald is a 27 y.o. year old Caucasian female here for Implanon removal.  Patient given informed consent for removal of her Nexplanon.  BP 115/67   Pulse 91   Ht 5\' 8"  (1.727 m)   Wt 203 lb 9 oz (92.3 kg)   BMI 30.95 kg/m   Appropriate time out taken. Implanon site identified.  Area prepped in usual sterile fashon. One cc of 2% lidocaine was used to anesthetize the area at the distal end of the implant. A small stab incision was made right beside the implant on the distal portion.  The Nexplanon rod was grasped using hemostats and removed without difficulty.  There was less than 3 cc blood loss. There were no complications.  Steri-strips were applied over the small incision and a pressure bandage was applied.  The patient tolerated the procedure well.  She was instructed to keep the area clean and dry, remove pressure bandage in 24 hours, and keep insertion site covered with the steri-strip for 3-5 days.    Reviewed red flag symptoms and when to call.   RTC as needed.    Gunnar BullaJenkins Michelle Koleen Celia, CNM [facility]

## 2017-09-30 ENCOUNTER — Other Ambulatory Visit: Payer: Self-pay | Admitting: Unknown Physician Specialty

## 2017-11-26 ENCOUNTER — Ambulatory Visit: Payer: Self-pay | Admitting: Medical

## 2017-11-26 ENCOUNTER — Encounter: Payer: Self-pay | Admitting: Medical

## 2017-11-26 VITALS — BP 120/70 | HR 82 | Temp 98.2°F | Wt 202.0 lb

## 2017-11-26 DIAGNOSIS — H6502 Acute serous otitis media, left ear: Secondary | ICD-10-CM

## 2017-11-26 DIAGNOSIS — J4540 Moderate persistent asthma, uncomplicated: Secondary | ICD-10-CM

## 2017-11-26 DIAGNOSIS — Z760 Encounter for issue of repeat prescription: Secondary | ICD-10-CM

## 2017-11-26 DIAGNOSIS — J019 Acute sinusitis, unspecified: Secondary | ICD-10-CM

## 2017-11-26 MED ORDER — AMOXICILLIN-POT CLAVULANATE 875-125 MG PO TABS: 1.0000 | ORAL_TABLET | Freq: Two times a day (BID) | ORAL | 0 refills | Status: DC

## 2017-11-26 MED ORDER — ALBUTEROL SULFATE HFA 108 (90 BASE) MCG/ACT IN AERS: 2.0000 | INHALATION_SPRAY | Freq: Four times a day (QID) | RESPIRATORY_TRACT | 0 refills | Status: DC | PRN

## 2017-11-26 NOTE — Patient Instructions (Addendum)
Sinusitis, Adult Sinusitis is soreness and inflammation of your sinuses. Sinuses are hollow spaces in the bones around your face. They are located:  Around your eyes.  In the middle of your forehead.  Behind your nose.  In your cheekbones.  Your sinuses and nasal passages are lined with a stringy fluid (mucus). Mucus normally drains out of your sinuses. When your nasal tissues get inflamed or swollen, the mucus can get trapped or blocked so air cannot flow through your sinuses. This lets bacteria, viruses, and funguses grow, and that leads to infection. Follow these instructions at home: Medicines  Take, use, or apply over-the-counter and prescription medicines only as told by your doctor. These may include nasal sprays.  If you were prescribed an antibiotic medicine, take it as told by your doctor. Do not stop taking the antibiotic even if you start to feel better. Hydrate and Humidify  Drink enough water to keep your pee (urine) clear or pale yellow.  Use a cool mist humidifier to keep the humidity level in your home above 50%.  Breathe in steam for 10-15 minutes, 3-4 times a day or as told by your doctor. You can do this in the bathroom while a hot shower is running.  Try not to spend time in cool or dry air. Rest  Rest as much as possible.  Sleep with your head raised (elevated).  Make sure to get enough sleep each night. General instructions  Put a warm, moist washcloth on your face 3-4 times a day or as told by your doctor. This will help with discomfort.  Wash your hands often with soap and water. If there is no soap and water, use hand sanitizer.  Do not smoke. Avoid being around people who are smoking (secondhand smoke).  Keep all follow-up visits as told by your doctor. This is important. Contact a doctor if:  You have a fever.  Your symptoms get worse.  Your symptoms do not get better within 10 days. Get help right away if:  You have a very bad  headache.  You cannot stop throwing up (vomiting).  You have pain or swelling around your face or eyes.  You have trouble seeing.  You feel confused.  Your neck is stiff.  You have trouble breathing. This information is not intended to replace advice given to you by your health care provider. Make sure you discuss any questions you have with your health care provider. Document Released: 12/10/2007 Document Revised: 02/17/2016 Document Reviewed: 04/18/2015 Elsevier Interactive Patient Education  2018 Elsevier Inc. Otitis Media, Adult Otitis media is redness, soreness, and puffiness (swelling) in the space just behind your eardrum (middle ear). It may be caused by allergies or infection. It often happens along with a cold. Follow these instructions at home:  Take your medicine as told. Finish it even if you start to feel better.  Only take over-the-counter or prescription medicines for pain, discomfort, or fever as told by your doctor.  Follow up with your doctor as told. Contact a doctor if:  You have otitis media only in one ear, or bleeding from your nose, or both.  You notice a lump on your neck.  You are not getting better in 3-5 days.  You feel worse instead of better. Get help right away if:  You have pain that is not helped with medicine.  You have puffiness, redness, or pain around your ear.  You get a stiff neck.  You cannot move part of your face (  paralysis).  You notice that the bone behind your ear hurts when you touch it. This information is not intended to replace advice given to you by your health care provider. Make sure you discuss any questions you have with your health care provider. Document Released: 12/10/2007 Document Revised: 11/29/2015 Document Reviewed: 01/18/2013 Elsevier Interactive Patient Education  2017 Elsevier Inc.  

## 2017-11-26 NOTE — Progress Notes (Signed)
Subjective:    Patient ID: Miranda Mcdonald, female    DOB: 1991/04/12, 27 y.o.   MRN: [id number]  HPI 27 yo female in non acute distress. Six days of  Sinus pain and pressure ( right side of the face,  ear pain  On/ off more on the left than the right.  , Used Excedrin for pain yesterday with some relief. Vomiting with Migraine on Saturday. Denies fever or chills, some of shortness of breath  With walking has history of Asthma, and no chest pain.   Review of Systems  Constitutional: Negative for chills and fever.  HENT: Positive for congestion, ear pain, postnasal drip, rhinorrhea, sinus pressure (right side), sinus pain, sneezing and sore throat. Negative for ear discharge, facial swelling, tinnitus, trouble swallowing and voice change.   Eyes: Positive for pain (both more on the right) and itching. Negative for discharge.  Respiratory: Positive for cough (some non productive).   Cardiovascular: Negative for chest pain, palpitations and leg swelling.  Gastrointestinal: Negative for abdominal pain.  Endocrine: Negative for polydipsia, polyphagia and polyuria.  Genitourinary: Negative for dysuria.  Musculoskeletal: Positive for myalgias.  Skin: Negative for rash.  Allergic/Immunologic: Negative for environmental allergies, food allergies and immunocompromised state.  Neurological: Positive for light-headedness (couple of times today) and headaches. Negative for dizziness and syncope.  Hematological: Negative for adenopathy.  Psychiatric/Behavioral: Negative for behavioral problems, confusion, self-injury and suicidal ideas. The patient is not nervous/anxious.       Husband with Vasectomy  Patient with no implant due to estrogen type cancers in her family  Objective:   Physical Exam  Constitutional: She is oriented to person, place, and time. She appears well-developed and well-nourished.  HENT:  Head: Normocephalic and atraumatic.  Right Ear: Hearing, external ear and ear canal normal.  A middle ear effusion is present.  Left Ear: Hearing, external ear and ear canal normal. Tympanic membrane is erythematous. A middle ear effusion is present.  Nose: Mucosal edema and rhinorrhea present.  Mouth/Throat: Oropharynx is clear and moist and mucous membranes are normal. Uvula swelling present. Tonsils are 0 on the right. Tonsils are 0 on the left. No tonsillar exudate.  Eyes: Pupils are equal, round, and reactive to light. Conjunctivae and EOM are normal.  Neck: Normal range of motion. Neck supple.  Cardiovascular: Normal rate, regular rhythm and normal heart sounds.  Pulmonary/Chest: Effort normal and breath sounds normal. No stridor. No respiratory distress. She has no wheezes. She has no rales.  Lymphadenopathy:    She has cervical adenopathy.  Neurological: She is alert and oriented to person, place, and time.  Skin: Skin is warm and dry.  Psychiatric: She has a normal mood and affect. Her behavior is normal. Judgment and thought content normal.  Nursing note and vitals reviewed.  No cough in the room Erythema in nares R>L    Assessment & Plan:  Sinusitis,  Left otits media Meds ordered this encounter  Medications  . albuterol (PROVENTIL HFA;VENTOLIN HFA) 108 (90 Base) MCG/ACT inhaler    Sig: Inhale 2 puffs into the lungs every 6 (six) hours as needed for wheezing or shortness of breath.    Dispense:  1 Inhaler    Refill:  0    Please give Ventolin  . amoxicillin-clavulanate (AUGMENTIN) 875-125 MG tablet    Sig: Take 1 tablet by mouth 2 (two) times daily.    Dispense:  20 tablet    Refill:  0  Rest , increase fluids, Gatorade if not eating  well. OTC Motrin or Tylenol for fever or pain.  Follow up in 3-5 days if not improving. Patient verbalizes understanding and has no questions at discharge.

## 2017-12-01 ENCOUNTER — Telehealth: Payer: Self-pay | Admitting: Emergency Medicine

## 2017-12-01 NOTE — Telephone Encounter (Signed)
Return to clinic for reevaluation. Thank you.

## 2017-12-01 NOTE — Telephone Encounter (Signed)
Spoke with provider on sight today stated that she will start patient on prednisone and see if that will help with her ears and if not recommends patient to see PCP about this issue. Per patient acknowledge understanding and would like to start steroid. Patient uses [facility]

## 2017-12-01 NOTE — Telephone Encounter (Signed)
Contacted patient whom informed me that she is still having left ear pain/HA and feels like it is swimming in her ears per patient. Still on antibiotic that was prescribed.  Do you have any further recommedation. I informed patient to complete antibiotic but she doesn't feel as if it is working . Please advise. Patient uses [facility] pharmacy

## 2017-12-02 MED ORDER — PREDNISONE 20 MG PO TABS: 20.0000 mg | ORAL_TABLET | Freq: Every day | ORAL | 0 refills | Status: AC

## 2017-12-02 NOTE — Telephone Encounter (Signed)
Will trial a short course of prednisone 20 mg x 5 days for suspected eustachian tube dysfunction secondary to otitis media and acute sinusitis infection. If no improvement, patient was advised to follow-up with PCP as she will likely warrant an ENT referral.   Godfrey Pick. Tiburcio Pea, MSN, FNP-C [facility]  [address]  [place], [place] [address] [phone]

## 2017-12-10 ENCOUNTER — Ambulatory Visit
Admission: EM | Admit: 2017-12-10 | Discharge: 2017-12-10 | Disposition: A | Discharge status: Home / Self Care | Payer: 59 | Attending: Family Medicine | Admitting: Family Medicine

## 2017-12-10 ENCOUNTER — Telehealth: Payer: Self-pay

## 2017-12-10 ENCOUNTER — Encounter: Payer: Self-pay | Admitting: Emergency Medicine

## 2017-12-10 ENCOUNTER — Other Ambulatory Visit: Payer: Self-pay

## 2017-12-10 DIAGNOSIS — H6982 Other specified disorders of Eustachian tube, left ear: Secondary | ICD-10-CM | POA: Diagnosis not present

## 2017-12-10 DIAGNOSIS — H6992 Unspecified Eustachian tube disorder, left ear: Secondary | ICD-10-CM

## 2017-12-10 MED ORDER — PSEUDOEPHEDRINE HCL 60 MG PO TABS: 60.0000 mg | ORAL_TABLET | Freq: Four times a day (QID) | ORAL | 0 refills | Status: DC | PRN

## 2017-12-10 MED ORDER — FLUTICASONE PROPIONATE 50 MCG/ACT NA SUSP: 2.0000 | Freq: Every day | NASAL | 0 refills | Status: DC

## 2017-12-10 NOTE — ED Triage Notes (Addendum)
Patient in today c/o ear pain L>R. Patient states she was seen ~ [facility] weeks at [facility] and was treated for an ear and sinus infection and treated with antibiotics and steroids. Patient states she feels like ear infection is returning.

## 2017-12-10 NOTE — ED Provider Notes (Signed)
[facility]    CSN: [id number] Arrival date & time: 12/10/17  1539  History   Chief Complaint Chief Complaint  Patient presents with  . Otalgia   HPI  27 year old female presents with ear pain.  Patient reports that she has had ongoing ear pain for the past few days.  Left greater than right.  She was recently seen at [facility] and was treated for bilateral otitis media and acute sinusitis.  She states that she got tremendously better but then had recurrence of her ear pain over the past few days.  She reports "sloshing" of her left ear.  Moderate pain.  Associated headache.  No known exacerbating relieving factors.  No other complaints.  Past Medical History:  Diagnosis Date  . Asthma   . Hypoglycemia   . Lactose intolerance in adult   . Migraines     Patient Active Problem List   Diagnosis Date Noted  . Migraine 02/22/2016  . Asthma, moderate persistent 01/14/2016  . RUQ pain 05/04/2015    Past Surgical History:  Procedure Laterality Date  . CLAVICLE SURGERY Left 2007  . CLAVICLE SURGERY     surgery to remove pin from first surgery  . KNEE SURGERY Left   . WISDOM TOOTH EXTRACTION      OB History    Gravida  1   Para  1   Term  1   Preterm      AB      Living  1     SAB      TAB      Ectopic      Multiple      Live Births  1            Home Medications    Prior to Admission medications   Medication Sig Start Date End Date Taking? Authorizing Provider  albuterol (PROVENTIL HFA;VENTOLIN HFA) 108 (90 Base) MCG/ACT inhaler Inhale 2 puffs into the lungs every 6 (six) hours as needed for wheezing or shortness of breath. 11/26/17  Yes Ratcliffe, Heather R, PA-C  Fluticasone-Salmeterol (ADVAIR) 100-50 MCG/DOSE AEPB Inhale 1 puff into the lungs 2 (two) times daily. 03/04/17  Yes Gabriel Cirri, NP  ketorolac (TORADOL) 10 MG tablet Take 1 tablet (10 mg total) by mouth every 6 (six) hours as needed. 03/25/17  Yes Gabriel Cirri, NP    ondansetron (ZOFRAN) 4 MG tablet Take 4 mg by mouth every 8 (eight) hours as needed for nausea or vomiting.   Yes [provider]  SUMAtriptan (IMITREX) 100 MG tablet Take 1 tablet (100 mg total) by mouth every 2 (two) hours as needed for migraine. May repeat in 2 hours if headache persists or recurs. 02/22/16  Yes Gabriel Cirri, NP  ammonium lactate (LAC-HYDRIN) 12 % lotion Apply topically. 07/14/11   [provider]  fluticasone (FLONASE) 50 MCG/ACT nasal spray Place 2 sprays into both nostrils daily. 12/10/17   Tommie Sams, DO  promethazine (PHENERGAN) 25 MG tablet Take 1 tablet (25 mg total) by mouth every 8 (eight) hours as needed for nausea or vomiting. 10/15/16   Gabriel Cirri, NP  pseudoephedrine (SUDAFED) 60 MG tablet Take 1 tablet (60 mg total) by mouth every 6 (six) hours as needed (ear pain/congestion). 12/10/17   Tommie Sams, DO    Family History Family History  Problem Relation Age of Onset  . Bipolar disorder Mother   . Hypothyroidism Mother   . Restless legs syndrome Mother   . Neuropathy Father   .  Depression Father   . Bipolar disorder Sister   . Hypothyroidism Sister   . Bipolar disorder Brother   . Heart murmur Daughter   . Speech disorder Daughter   . Arthritis Maternal Grandmother   . Hypotension Maternal Grandmother   . Diabetes Maternal Grandfather   . Depression Maternal Grandfather   . Hyperlipidemia Maternal Grandfather   . Liver disease Maternal Grandfather        fatty liver  . Bipolar disorder Maternal Grandfather   . Hypothyroidism Maternal Grandfather   . Hypertension Paternal Grandmother   . Depression Paternal Grandmother   . Depression Paternal Grandfather   . Breast cancer Maternal Aunt   . Cancer Maternal Aunt        breast  . Breast cancer Other   . Breast cancer Other     Social History Social History   Tobacco Use  . Smoking status: Never Smoker  . Smokeless tobacco: Never Used  Substance Use Topics  . Alcohol  use: Yes    Comment: occasional- pt states once in a while  . Drug use: No     Allergies   Morphine and related and Percocet [oxycodone-acetaminophen]   Review of Systems Review of Systems  Constitutional: Negative for fever.  HENT: Positive for ear pain.   Neurological: Positive for headaches.   Physical Exam Triage Vital Signs ED Triage Vitals [12/10/17 1556]  Enc Vitals Group     BP 117/78     Pulse Rate 94     Resp 16     Temp 98.2 F (36.8 C)     Temp Source Oral     SpO2 100 %     Weight 190 lb (86.2 kg)     Height 5\' 9"  (1.753 m)     Head Circumference      Peak Flow      Pain Score 3     Pain Loc      Pain Edu?      Excl. in GC?    Updated Vital Signs BP 117/78 (BP Location: Right Arm)   Pulse 94   Temp 98.2 F (36.8 C) (Oral)   Resp 16   Ht 5\' 9"  (1.753 m)   Wt 190 lb (86.2 kg)   SpO2 100%   BMI 28.06 kg/m  Physical Exam  Constitutional: She is oriented to person, place, and time. She appears well-developed. No distress.  HENT:  Head: Normocephalic and atraumatic.  Mouth/Throat: Oropharynx is clear and moist.  Normal TMs bilaterally.  Cardiovascular: Normal rate and regular rhythm.  Pulmonary/Chest: Effort normal and breath sounds normal. She has no wheezes. She has no rales.  Neurological: She is alert and oriented to person, place, and time.  Psychiatric: She has a normal mood and affect. Her behavior is normal.  Nursing note and vitals reviewed.  UC Treatments / Results  Labs (all labs ordered are listed, but only abnormal results are displayed) Labs Reviewed - No data to display  EKG None  Radiology No results found.  Procedures Procedures (including critical care time)  Medications Ordered in UC Medications - No data to display  Initial Impression / Assessment and Plan / UC Course  I have reviewed the triage vital signs and the nursing notes.  Pertinent labs & imaging results that were available during my care of the patient  were reviewed by me and considered in my medical decision making (see chart for details).    27 year old female presents with eustachian tube dysfunction.  Treating with Sudafed and Flonase.  Supportive care.  Final Clinical Impressions(s) / UC Diagnoses   Final diagnoses:  Dysfunction of left eustachian tube   Discharge Instructions   None    ED Prescriptions    Medication Sig Dispense Auth. Provider   pseudoephedrine (SUDAFED) 60 MG tablet Take 1 tablet (60 mg total) by mouth every 6 (six) hours as needed (ear pain/congestion). 30 tablet Damonica Chopra G, DO   fluticasone (FLONASE) 50 MCG/ACT nasal spray Place 2 sprays into both nostrils daily. 16 g Tommie Samsook, Doyal Saric G, DO     Controlled Substance Prescriptions [place] Controlled Substance Registry consulted? Not Applicable   Tommie SamsCook, Dailey Alberson G, DO 12/10/17 1700

## 2018-01-18 ENCOUNTER — Other Ambulatory Visit: Payer: Self-pay

## 2018-01-18 ENCOUNTER — Ambulatory Visit (INDEPENDENT_AMBULATORY_CARE_PROVIDER_SITE_OTHER): Payer: 59 | Admitting: Unknown Physician Specialty

## 2018-01-18 ENCOUNTER — Encounter: Payer: Self-pay | Admitting: Unknown Physician Specialty

## 2018-01-18 VITALS — BP 109/76 | HR 76 | Temp 98.6°F | Ht 68.0 in | Wt 200.0 lb

## 2018-01-18 DIAGNOSIS — R1032 Left lower quadrant pain: Secondary | ICD-10-CM | POA: Diagnosis not present

## 2018-01-18 DIAGNOSIS — Z87898 Personal history of other specified conditions: Secondary | ICD-10-CM | POA: Insufficient documentation

## 2018-01-18 LAB — UA/M W/RFLX CULTURE, ROUTINE
BILIRUBIN UA: NEGATIVE
Glucose, UA: NEGATIVE
KETONES UA: NEGATIVE
LEUKOCYTES UA: NEGATIVE
Nitrite, UA: NEGATIVE
PH UA: 6 (ref 5.0–7.5)
PROTEIN UA: NEGATIVE
RBC UA: NEGATIVE
Specific Gravity, UA: 1.01 (ref 1.005–1.030)
Urobilinogen, Ur: 0.2 mg/dL (ref 0.2–1.0)

## 2018-01-18 MED ORDER — NAPROXEN 500 MG PO TABS: 500.0000 mg | ORAL_TABLET | Freq: Two times a day (BID) | ORAL | 0 refills | Status: DC

## 2018-01-18 NOTE — Progress Notes (Signed)
BP 109/76   Pulse 76   Temp 98.6 F (37 C) (Oral)   Ht 5\' 8"  (1.727 m)   Wt 200 lb (90.7 kg)   SpO2 99%   BMI 30.41 kg/m    Subjective:    Patient ID: Sande BrothersAmelia Swopes, female    DOB: 10/13/1990, 27 y.o.   MRN: [id number]  HPI: Sande Brothersmelia Corvino is a 27 y.o. female  Chief Complaint  Patient presents with  . Abdominal Pain     pt states having left lower abd pain x 2 weeks   Pt is here for complaints of a 2 week history of pelvic discomfort following an exercise session.  However, finds it is getting worse with less amount of activity aggravating.  Hurts from sitting to standing.  She was on Nexplanon but had that removed.  Irregular menses are normal.  No fever, no vomiting, but has nausea.  No diarrhea but notes some change in stools.  Denies any problems urinating.  Last menstrual period was in June  Relevant past medical, surgical, family and social history reviewed and updated as indicated. Interim medical history since our last visit reviewed. Allergies and medications reviewed and updated.  Review of Systems  Constitutional: Negative.   HENT: Negative.   Eyes: Negative.   Respiratory: Negative.   Cardiovascular: Negative.   Gastrointestinal: Negative.   Endocrine: Negative.   Genitourinary: Negative.   Skin: Negative.   Allergic/Immunologic: Negative.   Neurological: Negative.   Hematological: Negative.   Psychiatric/Behavioral: Negative.     Per HPI unless specifically indicated above     Objective:    BP 109/76   Pulse 76   Temp 98.6 F (37 C) (Oral)   Ht 5\' 8"  (1.727 m)   Wt 200 lb (90.7 kg)   SpO2 99%   BMI 30.41 kg/m   Wt Readings from Last 3 Encounters:  01/18/18 200 lb (90.7 kg)  12/10/17 190 lb (86.2 kg)  11/26/17 202 lb (91.6 kg)    Physical Exam  Constitutional: She is oriented to person, place, and time. She appears well-developed and well-nourished. No distress.  HENT:  Head: Normocephalic and atraumatic.  Eyes: Conjunctivae and lids are  normal. Right eye exhibits no discharge. Left eye exhibits no discharge. No scleral icterus.  Neck: Normal range of motion. Neck supple. No JVD present. Carotid bruit is not present.  Cardiovascular: Normal rate, regular rhythm and normal heart sounds.  Pulmonary/Chest: Effort normal and breath sounds normal.  Abdominal: Normal appearance and bowel sounds are normal. There is no splenomegaly or hepatomegaly. There is no tenderness. There is no rebound and no CVA tenderness.  Genitourinary: Vagina normal and uterus normal. Pelvic exam was performed with patient prone. There is no rash or tenderness on the right labia. There is no rash or tenderness on the left labia. Cervix exhibits no motion tenderness, no discharge and no friability. Right adnexum displays no mass and no tenderness. Left adnexum displays no mass and no tenderness.  Musculoskeletal: Normal range of motion.  Neurological: She is alert and oriented to person, place, and time.  Skin: Skin is warm, dry and intact. No rash noted. No pallor.  Psychiatric: She has a normal mood and affect. Her behavior is normal. Judgment and thought content normal.    Results for orders placed or performed in visit on 03/04/17  CBC with Differential/Platelet  Result Value Ref Range   WBC 6.8 3.4 - 10.8 x10E3/uL   RBC 4.58 3.77 - 5.28 x10E6/uL  Hemoglobin 13.1 11.1 - 15.9 g/dL   Hematocrit 16.1 09.6 - 46.6 %   MCV 87 79 - 97 fL   MCH 28.6 26.6 - 33.0 pg   MCHC 32.9 31.5 - 35.7 g/dL   RDW 04.5 40.9 - 81.1 %   Platelets 240 150 - 379 x10E3/uL   Neutrophils 71 Not Estab. %   Lymphs 22 Not Estab. %   Monocytes 6 Not Estab. %   Eos 1 Not Estab. %   Basos 0 Not Estab. %   Neutrophils Absolute 4.8 1.4 - 7.0 x10E3/uL   Lymphocytes Absolute 1.5 0.7 - 3.1 x10E3/uL   Monocytes Absolute 0.4 0.1 - 0.9 x10E3/uL   EOS (ABSOLUTE) 0.1 0.0 - 0.4 x10E3/uL   Basophils Absolute 0.0 0.0 - 0.2 x10E3/uL   Immature Granulocytes 0 Not Estab. %   Immature Grans  (Abs) 0.0 0.0 - 0.1 x10E3/uL  TSH  Result Value Ref Range   TSH 1.430 0.450 - 4.500 uIU/mL  Comprehensive metabolic panel  Result Value Ref Range   Glucose 87 65 - 99 mg/dL   BUN 11 6 - 20 mg/dL   Creatinine, Ser 9.14 0.57 - 1.00 mg/dL   GFR calc non Af Amer 108 >59 mL/min/1.73   GFR calc Af Amer 124 >59 mL/min/1.73   BUN/Creatinine Ratio 14 9 - 23   Sodium 139 134 - 144 mmol/L   Potassium 4.0 3.5 - 5.2 mmol/L   Chloride 103 96 - 106 mmol/L   CO2 21 20 - 29 mmol/L   Calcium 9.5 8.7 - 10.2 mg/dL   Total Protein 6.7 6.0 - 8.5 g/dL   Albumin 4.4 3.5 - 5.5 g/dL   Globulin, Total 2.3 1.5 - 4.5 g/dL   Albumin/Globulin Ratio 1.9 1.2 - 2.2   Bilirubin Total 0.3 0.0 - 1.2 mg/dL   Alkaline Phosphatase 51 39 - 117 IU/L   AST 15 0 - 40 IU/L   ALT 10 0 - 32 IU/L  Lipid Panel w/o Chol/HDL Ratio  Result Value Ref Range   Cholesterol, Total 176 100 - 199 mg/dL   Triglycerides 77 0 - 149 mg/dL   HDL 50 >78 mg/dL   VLDL Cholesterol Cal 15 5 - 40 mg/dL   LDL Calculated 295 (H) 0 - 99 mg/dL      Assessment & Plan:   Problem List Items Addressed This Visit    None    Visit Diagnoses    LLQ pain    -  Primary   Suspect illio-psoas tendonitis.  Rx for NSAIDs.  Pt ed on stretches.  Will get [place] with irregular menses.  Husband had vasectomy   Relevant Orders   CBC with Differential/Platelet   UA/M w/rflx Culture, Routine   US PELVIC COMPLETE WITH TRANSVAGINAL      Urine is negative   Follow up plan: Return if symptoms worsen or fail to improve.

## 2018-01-18 NOTE — Patient Instructions (Signed)
Iliopsoas tendonitis - youtube exercises and stretches

## 2018-01-19 ENCOUNTER — Encounter: Payer: Self-pay | Admitting: Unknown Physician Specialty

## 2018-01-19 LAB — CBC WITH DIFFERENTIAL/PLATELET
BASOS ABS: 0 10*3/uL (ref 0.0–0.2)
Basos: 0 %
EOS (ABSOLUTE): 0.1 10*3/uL (ref 0.0–0.4)
Eos: 1 %
HEMATOCRIT: 39.6 % (ref 34.0–46.6)
HEMOGLOBIN: 13.1 g/dL (ref 11.1–15.9)
IMMATURE GRANS (ABS): 0 10*3/uL (ref 0.0–0.1)
Immature Granulocytes: 0 %
LYMPHS ABS: 1.6 10*3/uL (ref 0.7–3.1)
LYMPHS: 24 %
MCH: 29.2 pg (ref 26.6–33.0)
MCHC: 33.1 g/dL (ref 31.5–35.7)
MCV: 88 fL (ref 79–97)
MONOCYTES: 5 %
Monocytes Absolute: 0.3 10*3/uL (ref 0.1–0.9)
NEUTROS ABS: 4.7 10*3/uL (ref 1.4–7.0)
Neutrophils: 70 %
Platelets: 233 10*3/uL (ref 150–450)
RBC: 4.48 x10E6/uL (ref 3.77–5.28)
RDW: 13.8 % (ref 12.3–15.4)
WBC: 6.8 10*3/uL (ref 3.4–10.8)

## 2018-01-19 NOTE — Progress Notes (Signed)
Normal labs.  Patient notified by letter.

## 2018-01-26 ENCOUNTER — Ambulatory Visit
Admission: RE | Admit: 2018-01-26 | Discharge: 2018-01-26 | Disposition: A | Discharge status: Home / Self Care | Payer: 59 | Source: Ambulatory Visit | Attending: Unknown Physician Specialty | Admitting: Unknown Physician Specialty

## 2018-01-26 DIAGNOSIS — N83201 Unspecified ovarian cyst, right side: Secondary | ICD-10-CM | POA: Diagnosis not present

## 2018-01-26 DIAGNOSIS — R1032 Left lower quadrant pain: Secondary | ICD-10-CM | POA: Insufficient documentation

## 2018-03-24 ENCOUNTER — Other Ambulatory Visit: Payer: Self-pay | Admitting: Unknown Physician Specialty

## 2018-04-19 ENCOUNTER — Encounter: Payer: 59 | Admitting: Family Medicine

## 2018-04-22 ENCOUNTER — Telehealth: Payer: Self-pay | Admitting: Family Medicine

## 2018-04-22 NOTE — Telephone Encounter (Signed)
Copied from CRM [id number]. Topic: Appointment Scheduling - Scheduling Inquiry for Clinic >> Apr 22, 2018  4:05 PM Lorayne Bender wrote: Reason for CRM:   PT states she wrote her TOC visit down with Dr. Laural Benes wrong.  PT now realizes she missed and wants to know if she can reschedule. Pt can be reached at [phone] . >> Apr 22, 2018  4:15 PM Adela Ports M wrote: Is this ok?

## 2018-04-22 NOTE — Telephone Encounter (Signed)
Yes. She doesn't need a transfer of care, but she can reschedule her physical.

## 2018-04-23 NOTE — Telephone Encounter (Signed)
appt rescheduled.

## 2018-04-25 ENCOUNTER — Other Ambulatory Visit: Payer: Self-pay

## 2018-04-25 ENCOUNTER — Ambulatory Visit
Admission: EM | Admit: 2018-04-25 | Discharge: 2018-04-25 | Disposition: A | Discharge status: Home / Self Care | Payer: 59 | Attending: Emergency Medicine | Admitting: Emergency Medicine

## 2018-04-25 DIAGNOSIS — R350 Frequency of micturition: Secondary | ICD-10-CM

## 2018-04-25 DIAGNOSIS — R300 Dysuria: Secondary | ICD-10-CM | POA: Diagnosis not present

## 2018-04-25 DIAGNOSIS — N390 Urinary tract infection, site not specified: Secondary | ICD-10-CM

## 2018-04-25 DIAGNOSIS — R3915 Urgency of urination: Secondary | ICD-10-CM

## 2018-04-25 LAB — URINALYSIS, COMPLETE (UACMP) WITH MICROSCOPIC
GLUCOSE, UA: NEGATIVE mg/dL
Ketones, ur: 15 mg/dL — AB
NITRITE: NEGATIVE
PROTEIN: 30 mg/dL — AB
Specific Gravity, Urine: 1.015 (ref 1.005–1.030)
pH: 7.5 (ref 5.0–8.0)

## 2018-04-25 MED ORDER — CEPHALEXIN 500 MG PO CAPS: 500.0000 mg | ORAL_CAPSULE | Freq: Two times a day (BID) | ORAL | 0 refills | Status: DC

## 2018-04-25 MED ORDER — PHENAZOPYRIDINE HCL 200 MG PO TABS: 200.0000 mg | ORAL_TABLET | Freq: Three times a day (TID) | ORAL | 0 refills | Status: DC

## 2018-04-25 NOTE — ED Provider Notes (Addendum)
[facility]    CSN: [id number] Arrival date & time: 04/25/18  1133     History   Chief Complaint Chief Complaint  Patient presents with  . Dysuria    HPI Miranda Mcdonald is a 27 y.o. female.   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  Frequency and urgency along with the in satiety.  Denies any fever or chills and has had no  vomiting.  Denies any back pain.  No vaginal discharge.          Past Medical History:  Diagnosis Date  . Asthma   . Hypoglycemia   . Lactose intolerance in adult   . Migraines     Patient Active Problem List   Diagnosis Date Noted  . History of abnormal Pap smear 01/18/2018  . Migraine 02/22/2016  . Asthma, moderate persistent 01/14/2016  . Heart palpitations 03/19/2015  . Contraceptive management 08/24/2012  . Screening for cervical cancer 08/24/2012  . Hypoglycemia 07/09/2011  . Reactive airway disease 07/09/2011    Past Surgical History:  Procedure Laterality Date  . CLAVICLE SURGERY Left 2007  . CLAVICLE SURGERY     surgery to remove pin from first surgery  . KNEE SURGERY Left   . WISDOM TOOTH EXTRACTION      OB History    Gravida  1   Para  1   Term  1   Preterm      AB      Living  1     SAB      TAB      Ectopic      Multiple      Live Births  1            Home Medications    Prior to Admission medications   Medication Sig Start Date End Date Taking? Authorizing Provider  SUMAtriptan 6 MG/0.5ML SOAJ Inject into the skin. 01/18/15  Yes [provider]  ADVAIR DISKUS 100-50 MCG/DOSE AEPB INHALE 1 PUFF INTO THE LUNGS 2 TIMES DAILY. 03/24/18   Trey Sailors, PA-C  albuterol (PROVENTIL HFA;VENTOLIN HFA) 108 (90 Base) MCG/ACT inhaler Inhale 2 puffs into the lungs every 6 (six) hours as needed for wheezing or shortness of breath. 11/26/17   Ratcliffe, Heather R, PA-C  ammonium lactate (LAC-HYDRIN) 12 %  lotion Apply topically. 07/14/11   [provider]  cephALEXin (KEFLEX) 500 MG capsule Take 1 capsule (500 mg total) by mouth 2 (two) times daily. 04/25/18   Lutricia Feil, PA-C  naproxen (NAPROSYN) 500 MG tablet Take 1 tablet (500 mg total) by mouth 2 (two) times daily with a meal. 01/18/18   Gabriel Cirri, NP  ondansetron (ZOFRAN) 4 MG tablet Take 4 mg by mouth every 8 (eight) hours as needed for nausea or vomiting.    [provider]  phenazopyridine (PYRIDIUM) 200 MG tablet Take 1 tablet (200 mg total) by mouth 3 (three) times daily. 04/25/18   Lutricia Feil, PA-C  promethazine (PHENERGAN) 25 MG tablet Take 1 tablet (25 mg total) by mouth every 8 (eight) hours as needed for nausea or vomiting. 10/15/16   Gabriel Cirri, NP  SUMAtriptan (IMITREX) 100 MG tablet Take 1 tablet (100 mg total) by mouth every 2 (two) hours as needed for migraine. May repeat in 2 hours if headache persists or recurs. 02/22/16   Gabriel Cirri, NP    Family History Family History  Problem  Relation Age of Onset  . Bipolar disorder Mother   . Hypothyroidism Mother   . Restless legs syndrome Mother   . Neuropathy Father   . Depression Father   . Bipolar disorder Sister   . Hypothyroidism Sister   . Bipolar disorder Brother   . Heart murmur Daughter   . Speech disorder Daughter   . Arthritis Maternal Grandmother   . Hypotension Maternal Grandmother   . Diabetes Maternal Grandfather   . Depression Maternal Grandfather   . Hyperlipidemia Maternal Grandfather   . Liver disease Maternal Grandfather        fatty liver  . Bipolar disorder Maternal Grandfather   . Hypothyroidism Maternal Grandfather   . Hypertension Paternal Grandmother   . Depression Paternal Grandmother   . Depression Paternal Grandfather   . Breast cancer Maternal Aunt   . Cancer Maternal Aunt        breast  . Breast cancer Other   . Breast cancer Other     Social History Social History   Tobacco Use  . Smoking  status: Never Smoker  . Smokeless tobacco: Never Used  Substance Use Topics  . Alcohol use: Yes    Comment: occasional- pt states once in a while  . Drug use: No     Allergies   Morphine and related and Percocet [oxycodone-acetaminophen]   Review of Systems Review of Systems  Constitutional: Positive for activity change. Negative for chills, fatigue and fever.  Genitourinary: Positive for decreased urine volume, dysuria, frequency and urgency. Negative for vaginal bleeding, vaginal discharge and vaginal pain.  All other systems reviewed and are negative.    Physical Exam Triage Vital Signs ED Triage Vitals [04/25/18 1141]  Enc Vitals Group     BP 119/85     Pulse Rate 82     Resp 16     Temp 98.2 F (36.8 C)     Temp Source Oral     SpO2 100 %     Weight 200 lb (90.7 kg)     Height 5\' 8"  (1.727 m)     Head Circumference      Peak Flow      Pain Score 2     Pain Loc      Pain Edu?      Excl. in GC?    No data found.  Updated Vital Signs BP 119/85 (BP Location: Left Arm)   Pulse 82   Temp 98.2 F (36.8 C) (Oral)   Resp 16   Ht 5\' 8"  (1.727 m)   Wt 200 lb (90.7 kg)   LMP 03/31/2018   SpO2 100%   BMI 30.41 kg/m   Visual Acuity Right Eye Distance:   Left Eye Distance:   Bilateral Distance:    Right Eye Near:   Left Eye Near:    Bilateral Near:     Physical Exam  Constitutional: She is oriented to person, place, and time. She appears well-developed and well-nourished. No distress.  HENT:  Head: Normocephalic.  Eyes: Pupils are equal, round, and reactive to light. Right eye exhibits no discharge. Left eye exhibits no discharge.  Neck: Normal range of motion.  Pulmonary/Chest: Effort normal and breath sounds normal.  Abdominal: Soft. Bowel sounds are normal.  No CVA tenderness  Musculoskeletal: Normal range of motion.  Neurological: She is alert and oriented to person, place, and time.  Skin: Skin is warm and dry. She is not diaphoretic.    Psychiatric: She has a normal mood  and affect. Her behavior is normal. Judgment and thought content normal.  Nursing note and vitals reviewed.    UC Treatments / Results  Labs (all labs ordered are listed, but only abnormal results are displayed) Labs Reviewed  URINALYSIS, COMPLETE (UACMP) WITH MICROSCOPIC - Abnormal; Notable for the following components:      Result Value   APPearance CLOUDY (*)    Hgb urine dipstick SMALL (*)    Bilirubin Urine SMALL (*)    Ketones, ur 15 (*)    Protein, ur 30 (*)    Leukocytes, UA SMALL (*)    Bacteria, UA RARE (*)    All other components within normal limits  URINE CULTURE    EKG None  Radiology No results found.  Procedures Procedures (including critical care time)  Medications Ordered in UC Medications - No data to display  Initial Impression / Assessment and Plan / UC Course  I have reviewed the triage vital signs and the nursing notes.  Pertinent labs & imaging results that were available during my care of the patient were reviewed by me and considered in my medical decision making (see chart for details).     Will treat with Keflex 500 mg twice daily for 5 days we will also provide Pyridium for dysuria.  Urine was cultured and will be available in 48 hours. Final Clinical Impressions(s) / UC Diagnoses   Final diagnoses:  Dysuria  Lower urinary tract infectious disease   Discharge Instructions   None    ED Prescriptions    Medication Sig Dispense Auth. Provider   phenazopyridine (PYRIDIUM) 200 MG tablet Take 1 tablet (200 mg total) by mouth 3 (three) times daily. 6 tablet Ovid Curd P, PA-C   cephALEXin (KEFLEX) 500 MG capsule Take 1 capsule (500 mg total) by mouth 2 (two) times daily. 14 capsule Lutricia Feil, PA-C     Controlled Substance Prescriptions [place] Controlled Substance Registry consulted? Not Applicable   Lutricia Feil, PA-C 04/25/18 1719    Lutricia Feil, PA-C 04/25/18 1719

## 2018-04-25 NOTE — ED Triage Notes (Signed)
Pt reports 2-3 days of difficulty starting stream of urine and dysuria.

## 2018-04-27 LAB — URINE CULTURE: Culture: <10000 — AB

## 2018-05-05 ENCOUNTER — Encounter: Payer: Self-pay | Admitting: Family Medicine

## 2018-05-05 ENCOUNTER — Other Ambulatory Visit: Payer: Self-pay | Admitting: Family Medicine

## 2018-05-05 ENCOUNTER — Ambulatory Visit (INDEPENDENT_AMBULATORY_CARE_PROVIDER_SITE_OTHER): Payer: 59 | Admitting: Family Medicine

## 2018-05-05 VITALS — BP 106/78 | HR 67 | Temp 98.0°F | Wt 195.0 lb

## 2018-05-05 DIAGNOSIS — R300 Dysuria: Secondary | ICD-10-CM | POA: Diagnosis not present

## 2018-05-05 DIAGNOSIS — N760 Acute vaginitis: Secondary | ICD-10-CM | POA: Diagnosis not present

## 2018-05-05 DIAGNOSIS — B9689 Other specified bacterial agents as the cause of diseases classified elsewhere: Secondary | ICD-10-CM | POA: Diagnosis not present

## 2018-05-05 MED ORDER — METRONIDAZOLE 500 MG PO TABS: 500.0000 mg | ORAL_TABLET | Freq: Three times a day (TID) | ORAL | 0 refills | Status: DC

## 2018-05-05 NOTE — Progress Notes (Signed)
BP 106/78   Pulse 67   Temp 98 F (36.7 C) (Oral)   Wt 195 lb (88.5 kg)   SpO2 98%   BMI 29.65 kg/m    Subjective:    Patient ID: Miranda Mcdonald, female    DOB: 1990/07/23, 27 y.o.   MRN: [id number]  HPI: Miranda Mcdonald is a 27 y.o. female  Chief Complaint  Patient presents with  . Urinary Frequency    treated w/ 1 round of keflex   Over a week of urinary frequency, dysuria, lower abdominal cramping, nausea, mild low back aching. Given keflex and pyridium through UC about a week ago which seemed to help but a day or so ago felt like sxs came back. No discharge, odor but wondering if the abx gave her a yeast infection. Denies fevers, chills, hematuria, N/V  Relevant past medical, surgical, family and social history reviewed and updated as indicated. Interim medical history since our last visit reviewed. Allergies and medications reviewed and updated.  Review of Systems  Per HPI unless specifically indicated above     Objective:    BP 106/78   Pulse 67   Temp 98 F (36.7 C) (Oral)   Wt 195 lb (88.5 kg)   SpO2 98%   BMI 29.65 kg/m   Wt Readings from Last 3 Encounters:  05/05/18 195 lb (88.5 kg)  04/25/18 200 lb (90.7 kg)  01/18/18 200 lb (90.7 kg)    Physical Exam  Constitutional: She is oriented to person, place, and time. She appears well-developed and well-nourished.  HENT:  Head: Atraumatic.  Eyes: Conjunctivae and EOM are normal.  Neck: Normal range of motion. Neck supple.  Cardiovascular: Normal rate and regular rhythm.  Pulmonary/Chest: Effort normal and breath sounds normal.  Abdominal: Soft. Bowel sounds are normal. She exhibits no distension and no mass. There is tenderness (mild suprapubic ttp). There is no rebound.  Musculoskeletal: Normal range of motion.  Neurological: She is alert and oriented to person, place, and time.  Skin: Skin is warm and dry.  Psychiatric: She has a normal mood and affect. Her behavior is normal.  Nursing note and vitals  reviewed.   Results for orders placed or performed during the hospital encounter of 04/25/18  Urine culture  Result Value Ref Range   Specimen Description      URINE, CLEAN CATCH Performed at [facility], [address], [place], [place] [address]    Special Requests      NONE Performed at [facility], [address] [place], [place] [address]    Culture (A)     <10,000 COLONIES/mL INSIGNIFICANT GROWTH Performed at [facility], [address], [place], [place] [address]    Report Status 04/27/2018 FINAL   Urinalysis, Complete w Microscopic  Result Value Ref Range   Color, Urine YELLOW YELLOW   APPearance CLOUDY (A) CLEAR   Specific Gravity, Urine 1.015 1.005 - 1.030   pH 7.5 5.0 - 8.0   Glucose, UA NEGATIVE NEGATIVE mg/dL   Hgb urine dipstick SMALL (A) NEGATIVE   Bilirubin Urine SMALL (A) NEGATIVE   Ketones, ur 15 (A) NEGATIVE mg/dL   Protein, ur 30 (A) NEGATIVE mg/dL   Nitrite NEGATIVE NEGATIVE   Leukocytes, UA SMALL (A) NEGATIVE   Squamous Epithelial / LPF 0-5 0 - 5   WBC, UA >50 0 - 5 WBC/hpf   RBC / HPF 6-10 0 - 5 RBC/hpf   Bacteria, UA RARE (A) NONE SEEN  Assessment & Plan:   Problem List Items Addressed This Visit    None    Visit Diagnoses    BV (bacterial vaginosis)    -  Primary   Wet prep +. Start flagyl, probiotics. Good vaginal hygiene reviewed   Relevant Medications   metroNIDAZOLE (FLAGYL) 500 MG tablet   Other Relevant Orders   UA/M w/rflx Culture, Routine   Dysuria       U/A neg for UTI. Push fluids, probiotics. F/u if worsening sxs   Relevant Orders   WET PREP FOR TRICH, YEAST, CLUE       Follow up plan: Return if symptoms worsen or fail to improve.

## 2018-05-06 LAB — URINALYSIS, ROUTINE W REFLEX MICROSCOPIC
Bilirubin, UA: NEGATIVE
GLUCOSE, UA: NEGATIVE
Ketones, UA: NEGATIVE
Nitrite, UA: NEGATIVE
PROTEIN UA: NEGATIVE
Specific Gravity, UA: 1.01 (ref 1.005–1.030)
UUROB: 0.2 mg/dL (ref 0.2–1.0)
pH, UA: 7 (ref 5.0–7.5)

## 2018-05-06 LAB — MICROSCOPIC EXAMINATION

## 2018-05-06 LAB — WET PREP FOR TRICH, YEAST, CLUE
CLUE CELL EXAM: POSITIVE — AB
TRICHOMONAS EXAM: NEGATIVE
YEAST EXAM: NEGATIVE

## 2018-05-09 NOTE — Patient Instructions (Signed)
Follow up as needed

## 2018-05-24 ENCOUNTER — Encounter: Payer: Self-pay | Admitting: Unknown Physician Specialty

## 2018-05-24 ENCOUNTER — Encounter: Payer: Self-pay | Admitting: Family Medicine

## 2018-06-04 ENCOUNTER — Encounter: Payer: 59 | Admitting: Family Medicine

## 2018-06-11 ENCOUNTER — Other Ambulatory Visit: Payer: Self-pay

## 2018-06-11 DIAGNOSIS — J4540 Moderate persistent asthma, uncomplicated: Secondary | ICD-10-CM

## 2018-06-11 MED ORDER — ALBUTEROL SULFATE HFA 108 (90 BASE) MCG/ACT IN AERS: 2.0000 | INHALATION_SPRAY | Freq: Four times a day (QID) | RESPIRATORY_TRACT | 0 refills | Status: DC | PRN

## 2018-06-11 NOTE — Telephone Encounter (Signed)
Refill request

## 2018-06-21 ENCOUNTER — Encounter: Payer: 59 | Admitting: Family Medicine

## 2018-06-21 NOTE — Progress Notes (Deleted)
There were no vitals taken for this visit.   Subjective:    Patient ID: Miranda Mcdonald, female    DOB: 09/14/1990, 27 y.o.   MRN: [id number]  HPI: Miranda Brothersmelia Kamphuis is a 27 y.o. female presenting on 06/21/2018 for comprehensive medical examination. Current medical complaints include:{Blank single:19197::"none","***"}  She currently lives with: Menopausal Symptoms: {Blank single:19197::"yes","no"}  Depression Screen done today and results listed below:  [facility] 03/04/2017 02/22/2016  Decreased Interest 0 0  Down, Depressed, Hopeless 0 0  PHQ - 2 Score 0 0  Altered sleeping 3 -  Tired, decreased energy 0 -  Change in appetite 0 -  Feeling bad or failure about yourself  0 -  Trouble concentrating 1 -  Moving slowly or fidgety/restless 0 -  Suicidal thoughts 0 -  PHQ-9 Score 4 -    Past Medical History:  Past Medical History:  Diagnosis Date  . Asthma   . Hypoglycemia   . Lactose intolerance in adult   . Migraines     Surgical History:  Past Surgical History:  Procedure Laterality Date  . CLAVICLE SURGERY Left 2007  . CLAVICLE SURGERY     surgery to remove pin from first surgery  . KNEE SURGERY Left   . WISDOM TOOTH EXTRACTION      Medications:  Current Outpatient Medications on File Prior to Visit  Medication Sig  . ADVAIR DISKUS 100-50 MCG/DOSE AEPB INHALE 1 PUFF INTO THE LUNGS 2 TIMES DAILY.  Marland Kitchen. albuterol (PROVENTIL HFA;VENTOLIN HFA) 108 (90 Base) MCG/ACT inhaler Inhale 2 puffs into the lungs every 6 (six) hours as needed for wheezing or shortness of breath.  Marland Kitchen. ammonium lactate (LAC-HYDRIN) 12 % lotion Apply topically.  . cephALEXin (KEFLEX) 500 MG capsule Take 1 capsule (500 mg total) by mouth 2 (two) times daily. (Patient not taking: Reported on 05/05/2018)  . metroNIDAZOLE (FLAGYL) 500 MG tablet Take 1 tablet (500 mg total) by mouth 3 (three) times daily.  . naproxen (NAPROSYN) 500 MG tablet Take 1 tablet (500 mg total) by mouth 2 (two) times daily  with a meal. (Patient not taking: Reported on 05/05/2018)  . ondansetron (ZOFRAN) 4 MG tablet Take 4 mg by mouth every 8 (eight) hours as needed for nausea or vomiting.  . phenazopyridine (PYRIDIUM) 200 MG tablet Take 1 tablet (200 mg total) by mouth 3 (three) times daily. (Patient not taking: Reported on 05/05/2018)  . promethazine (PHENERGAN) 25 MG tablet Take 1 tablet (25 mg total) by mouth every 8 (eight) hours as needed for nausea or vomiting.  . SUMAtriptan (IMITREX) 100 MG tablet Take 1 tablet (100 mg total) by mouth every 2 (two) hours as needed for migraine. May repeat in 2 hours if headache persists or recurs.  . SUMAtriptan 6 MG/0.5ML SOAJ Inject into the skin.   No current facility-administered medications on file prior to visit.     Allergies:  Allergies  Allergen Reactions  . Morphine And Related Other (See Comments)    Mean, angry, violent   . Percocet [Oxycodone-Acetaminophen] Other (See Comments)    Migraine    Social History:  Social History   Socioeconomic History  . Marital status: Married    Spouse name: Not on file  . Number of children: Not on file  . Years of education: Not on file  . Highest education level: Not on file  Occupational History  . Not on file  Social Needs  . Financial resource strain: Not on file  . Food  insecurity:    Worry: Not on file    Inability: Not on file  . Transportation needs:    Medical: Not on file    Non-medical: Not on file  Tobacco Use  . Smoking status: Never Smoker  . Smokeless tobacco: Never Used  Substance and Sexual Activity  . Alcohol use: Yes    Comment: occasional- pt states once in a while  . Drug use: No  . Sexual activity: Yes    Birth control/protection: Implant  Lifestyle  . Physical activity:    Days per week: Not on file    Minutes per session: Not on file  . Stress: Not on file  Relationships  . Social connections:    Talks on phone: Not on file    Gets together: Not on file    Attends  religious service: Not on file    Active member of club or organization: Not on file    Attends meetings of clubs or organizations: Not on file    Relationship status: Not on file  . Intimate partner violence:    Fear of current or ex partner: Not on file    Emotionally abused: Not on file    Physically abused: Not on file    Forced sexual activity: Not on file  Other Topics Concern  . Not on file  Social History Narrative  . Not on file   Social History   Tobacco Use  Smoking Status Never Smoker  Smokeless Tobacco Never Used   Social History   Substance and Sexual Activity  Alcohol Use Yes   Comment: occasional- pt states once in a while    Family History:  Family History  Problem Relation Age of Onset  . Bipolar disorder Mother   . Hypothyroidism Mother   . Restless legs syndrome Mother   . Neuropathy Father   . Depression Father   . Bipolar disorder Sister   . Hypothyroidism Sister   . Bipolar disorder Brother   . Heart murmur Daughter   . Speech disorder Daughter   . Arthritis Maternal Grandmother   . Hypotension Maternal Grandmother   . Diabetes Maternal Grandfather   . Depression Maternal Grandfather   . Hyperlipidemia Maternal Grandfather   . Liver disease Maternal Grandfather        fatty liver  . Bipolar disorder Maternal Grandfather   . Hypothyroidism Maternal Grandfather   . Hypertension Paternal Grandmother   . Depression Paternal Grandmother   . Depression Paternal Grandfather   . Breast cancer Maternal Aunt   . Cancer Maternal Aunt        breast  . Breast cancer Other   . Breast cancer Other     Past medical history, surgical history, medications, allergies, family history and social history reviewed with patient today and changes made to appropriate areas of the chart.   ROS  All other ROS negative except what is listed above and in the HPI.      Objective:    There were no vitals taken for this visit.  Wt Readings from Last 3  Encounters:  05/05/18 195 lb (88.5 kg)  04/25/18 200 lb (90.7 kg)  01/18/18 200 lb (90.7 kg)    Physical Exam  Results for orders placed or performed in visit on 06/21/18  HM PAP SMEAR  Result Value Ref Range   HM Pap smear negative PAP- results in Epic       Assessment & Plan:   Problem List Items Addressed This  Visit    None       Follow up plan: No follow-ups on file.   LABORATORY TESTING:  - Pap smear: up to date  IMMUNIZATIONS:   - Tdap: Tetanus vaccination status reviewed: last tetanus booster within 10 years. - Influenza: Up to date - Pneumovax: {Blank single:19197::"Up to date","Administered today","Not applicable","Refused","Given elsewhere"} - HPV: {Blank single:19197::"Up to date","Administered today","Not applicable","Refused","Given elsewhere"}  PATIENT COUNSELING:   Advised to take 1 mg of folate supplement per day if capable of pregnancy.   Sexuality: Discussed sexually transmitted diseases, partner selection, use of condoms, avoidance of unintended pregnancy  and contraceptive alternatives.   Advised to avoid cigarette smoking.  I discussed with the patient that most people either abstain from alcohol or drink within safe limits (<=14/week and <=4 drinks/occasion for males, <=7/weeks and <= 3 drinks/occasion for females) and that the risk for alcohol disorders and other health effects rises proportionally with the number of drinks per week and how often a drinker exceeds daily limits.  Discussed cessation/primary prevention of drug use and availability of treatment for abuse.   Diet: Encouraged to adjust caloric intake to maintain  or achieve ideal body weight, to reduce intake of dietary saturated fat and total fat, to limit sodium intake by avoiding high sodium foods and not adding table salt, and to maintain adequate dietary potassium and calcium preferably from fresh fruits, vegetables, and low-fat dairy products.    stressed the importance of regular  exercise  Injury prevention: Discussed safety belts, safety helmets, smoke detector, smoking near bedding or upholstery.   Dental health: Discussed importance of regular tooth brushing, flossing, and dental visits.    NEXT PREVENTATIVE PHYSICAL DUE IN 1 YEAR. No follow-ups on file.

## 2018-06-22 NOTE — Progress Notes (Signed)
This encounter was created in error - please disregard.

## 2018-07-20 ENCOUNTER — Encounter: Payer: 59 | Admitting: Family Medicine

## 2018-07-23 ENCOUNTER — Ambulatory Visit (INDEPENDENT_AMBULATORY_CARE_PROVIDER_SITE_OTHER): Payer: 59 | Admitting: Family Medicine

## 2018-07-23 ENCOUNTER — Encounter: Payer: Self-pay | Admitting: Family Medicine

## 2018-07-23 ENCOUNTER — Other Ambulatory Visit: Payer: Self-pay

## 2018-07-23 VITALS — BP 100/68 | HR 78 | Temp 98.6°F | Ht 67.5 in | Wt 194.0 lb

## 2018-07-23 DIAGNOSIS — M25562 Pain in left knee: Secondary | ICD-10-CM

## 2018-07-23 DIAGNOSIS — Z0000 Encounter for general adult medical examination without abnormal findings: Secondary | ICD-10-CM

## 2018-07-23 DIAGNOSIS — G8929 Other chronic pain: Secondary | ICD-10-CM | POA: Diagnosis not present

## 2018-07-23 DIAGNOSIS — J4540 Moderate persistent asthma, uncomplicated: Secondary | ICD-10-CM

## 2018-07-23 DIAGNOSIS — G43109 Migraine with aura, not intractable, without status migrainosus: Secondary | ICD-10-CM

## 2018-07-23 LAB — UA/M W/RFLX CULTURE, ROUTINE
Bilirubin, UA: NEGATIVE
Glucose, UA: NEGATIVE
Ketones, UA: NEGATIVE
Leukocytes, UA: NEGATIVE
Nitrite, UA: NEGATIVE
Protein, UA: NEGATIVE
RBC, UA: NEGATIVE
SPEC GRAV UA: 1.02 (ref 1.005–1.030)
Urobilinogen, Ur: 0.2 mg/dL (ref 0.2–1.0)
pH, UA: 7 (ref 5.0–7.5)

## 2018-07-23 MED ORDER — SUMATRIPTAN SUCCINATE 100 MG PO TABS: 100.0000 mg | ORAL_TABLET | ORAL | 12 refills | Status: DC | PRN

## 2018-07-23 MED ORDER — PROMETHAZINE HCL 25 MG PO TABS: 25.0000 mg | ORAL_TABLET | Freq: Three times a day (TID) | ORAL | 6 refills | Status: DC | PRN

## 2018-07-23 MED ORDER — ALBUTEROL SULFATE HFA 108 (90 BASE) MCG/ACT IN AERS: 2.0000 | INHALATION_SPRAY | Freq: Four times a day (QID) | RESPIRATORY_TRACT | 6 refills | Status: DC | PRN

## 2018-07-23 MED ORDER — FLUTICASONE-SALMETEROL 100-50 MCG/DOSE IN AEPB: INHALATION_SPRAY | RESPIRATORY_TRACT | 6 refills | Status: DC

## 2018-07-23 NOTE — Patient Instructions (Signed)

## 2018-07-23 NOTE — Assessment & Plan Note (Signed)
Under good control on current regimen. Continue current regimen. Continue to monitor. Call with any concerns. Refills given.   

## 2018-07-23 NOTE — Progress Notes (Signed)
BP 100/68   Pulse 78   Temp 98.6 F (37 C) (Oral)   Ht 5' 7.5" (1.715 m)   Wt 194 lb (88 kg)   SpO2 99%   BMI 29.94 kg/m    Subjective:    Patient ID: Miranda Mcdonald, female    DOB: 03/19/1991, 28 y.o.   MRN: [id number]  HPI: Miranda Mcdonald is a 28 y.o. female presenting on 07/23/2018 for comprehensive medical examination. Current medical complaints include: Has R sided pain that seems to be worse with running and heavy breathing. Has been going on for a couple years. Has had RUQ [place] and HIDA which was negative.  Has had surgery on her L knee. Noticing now that she is having pain and swelling with some numbness going down L lateral leg to her toes. It's worse when she exercises- would like to see ortho again. Dr. Martha Clan did her surgery.  Migraines doing well. No concerns. Medicine helping. Needs refills.   She currently lives with: Husband and Kids Menopausal Symptoms: no  Depression Screen done today and results listed below:  Depression screen [facility] 2/9 07/23/2018 03/04/2017 02/22/2016  Decreased Interest 0 0 0  Down, Depressed, Hopeless 0 0 0  PHQ - 2 Score 0 0 0  Altered sleeping 1 3 -  Tired, decreased energy 1 0 -  Change in appetite 1 0 -  Feeling bad or failure about yourself  0 0 -  Trouble concentrating 0 1 -  Moving slowly or fidgety/restless 0 0 -  Suicidal thoughts 0 0 -  PHQ-9 Score 3 4 -  Difficult doing work/chores Not difficult at all - -    Past Medical History:  Past Medical History:  Diagnosis Date  . Asthma   . Heart palpitations 03/19/2015  . Hypoglycemia   . Lactose intolerance in adult   . Migraines     Surgical History:  Past Surgical History:  Procedure Laterality Date  . CLAVICLE SURGERY Left 2007  . CLAVICLE SURGERY     surgery to remove pin from first surgery  . KNEE SURGERY Left   . WISDOM TOOTH EXTRACTION      Medications:  Current Outpatient Medications on File Prior to Visit  Medication Sig  . ketorolac (TORADOL) 10 MG tablet  Take 10 mg by mouth as needed.  . ondansetron (ZOFRAN) 4 MG tablet Take 4 mg by mouth every 8 (eight) hours as needed for nausea or vomiting.  . SUMAtriptan 6 MG/0.5ML SOAJ Inject into the skin.   No current facility-administered medications on file prior to visit.     Allergies:  Allergies  Allergen Reactions  . Morphine And Related Other (See Comments)    Mean, angry, violent   . Percocet [Oxycodone-Acetaminophen] Other (See Comments)    Migraine    Social History:  Social History   Socioeconomic History  . Marital status: Married    Spouse name: Not on file  . Number of children: Not on file  . Years of education: Not on file  . Highest education level: Not on file  Occupational History  . Not on file  Social Needs  . Financial resource strain: Not on file  . Food insecurity:    Worry: Not on file    Inability: Not on file  . Transportation needs:    Medical: Not on file    Non-medical: Not on file  Tobacco Use  . Smoking status: Never Smoker  . Smokeless tobacco: Never Used  Substance and Sexual  Activity  . Alcohol use: Yes    Comment: occasional- pt states once in a while  . Drug use: No  . Sexual activity: Yes    Birth control/protection: Implant  Lifestyle  . Physical activity:    Days per week: Not on file    Minutes per session: Not on file  . Stress: Not on file  Relationships  . Social connections:    Talks on phone: Not on file    Gets together: Not on file    Attends religious service: Not on file    Active member of club or organization: Not on file    Attends meetings of clubs or organizations: Not on file    Relationship status: Not on file  . Intimate partner violence:    Fear of current or ex partner: Not on file    Emotionally abused: Not on file    Physically abused: Not on file    Forced sexual activity: Not on file  Other Topics Concern  . Not on file  Social History Narrative  . Not on file   Social History   Tobacco Use    Smoking Status Never Smoker  Smokeless Tobacco Never Used   Social History   Substance and Sexual Activity  Alcohol Use Yes   Comment: occasional- pt states once in a while    Family History:  Family History  Problem Relation Age of Onset  . Bipolar disorder Mother   . Hypothyroidism Mother   . Restless legs syndrome Mother   . Neuropathy Father   . Depression Father   . Bipolar disorder Sister   . Hypothyroidism Sister   . Bipolar disorder Brother   . Heart murmur Daughter   . Speech disorder Daughter   . Arthritis Maternal Grandmother   . Hypotension Maternal Grandmother   . Diabetes Maternal Grandfather   . Depression Maternal Grandfather   . Hyperlipidemia Maternal Grandfather   . Liver disease Maternal Grandfather        fatty liver  . Bipolar disorder Maternal Grandfather   . Hypothyroidism Maternal Grandfather   . Hypertension Paternal Grandmother   . Depression Paternal Grandmother   . Depression Paternal Grandfather   . Breast cancer Maternal Aunt   . Cancer Maternal Aunt        breast  . Breast cancer Other   . Breast cancer Other     Past medical history, surgical history, medications, allergies, family history and social history reviewed with patient today and changes made to appropriate areas of the chart.   Review of Systems  Constitutional: Negative.   HENT: Positive for hearing loss. Negative for congestion, ear discharge, ear pain, nosebleeds, sinus pain, sore throat and tinnitus.   Eyes: Negative.   Respiratory: Negative.  Negative for stridor.   Cardiovascular: Negative.   Musculoskeletal: Positive for joint pain. Negative for back pain, falls, myalgias and neck pain.  Skin: Negative.   Psychiatric/Behavioral: The patient is nervous/anxious.     All other ROS negative except what is listed above and in the HPI.      Objective:    BP 100/68   Pulse 78   Temp 98.6 F (37 C) (Oral)   Ht 5' 7.5" (1.715 m)   Wt 194 lb (88 kg)   SpO2  99%   BMI 29.94 kg/m   Wt Readings from Last 3 Encounters:  07/23/18 194 lb (88 kg)  05/05/18 195 lb (88.5 kg)  04/25/18 200 lb (90.7 kg)  Physical Exam Vitals signs and nursing note reviewed.  Constitutional:      General: She is not in acute distress.    Appearance: Normal appearance. She is not ill-appearing, toxic-appearing or diaphoretic.  HENT:     Head: Normocephalic and atraumatic.     Right Ear: Tympanic membrane, ear canal and external ear normal. There is no impacted cerumen.     Left Ear: Tympanic membrane, ear canal and external ear normal. There is no impacted cerumen.     Nose: Nose normal. No congestion or rhinorrhea.     Mouth/Throat:     Mouth: Mucous membranes are moist.     Pharynx: Oropharynx is clear. No oropharyngeal exudate or posterior oropharyngeal erythema.  Eyes:     General: No scleral icterus.       Right eye: No discharge.        Left eye: No discharge.     Extraocular Movements: Extraocular movements intact.     Conjunctiva/sclera: Conjunctivae normal.     Pupils: Pupils are equal, round, and reactive to light.  Neck:     Musculoskeletal: Normal range of motion and neck supple. No neck rigidity or muscular tenderness.     Vascular: No carotid bruit.  Cardiovascular:     Rate and Rhythm: Normal rate and regular rhythm.     Pulses: Normal pulses.     Heart sounds: No murmur. No friction rub. No gallop.   Pulmonary:     Effort: Pulmonary effort is normal. No respiratory distress.     Breath sounds: Normal breath sounds. No stridor. No wheezing, rhonchi or rales.  Chest:     Chest wall: No tenderness.  Abdominal:     General: Abdomen is flat. Bowel sounds are normal. There is no distension.     Palpations: Abdomen is soft. There is no mass.     Tenderness: There is no abdominal tenderness. There is no right CVA tenderness, left CVA tenderness, guarding or rebound.     Hernia: No hernia is present.  Genitourinary:    Comments: Breast and  pelvic exams deferred with shared decision making Musculoskeletal:        General: No swelling, tenderness, deformity or signs of injury.     Right lower leg: No edema.     Left lower leg: No edema.  Lymphadenopathy:     Cervical: No cervical adenopathy.  Skin:    General: Skin is warm and dry.     Capillary Refill: Capillary refill takes less than 2 seconds.     Coloration: Skin is not jaundiced or pale.     Findings: No bruising, erythema, lesion or rash.  Neurological:     General: No focal deficit present.     Mental Status: She is alert and oriented to person, place, and time. Mental status is at baseline.     Cranial Nerves: No cranial nerve deficit.     Sensory: No sensory deficit.     Motor: No weakness.     Coordination: Coordination normal.     Gait: Gait normal.     Deep Tendon Reflexes: Reflexes normal.  Psychiatric:        Mood and Affect: Mood normal.        Behavior: Behavior normal.        Thought Content: Thought content normal.        Judgment: Judgment normal.     Results for orders placed or performed in visit on 07/23/18  UA/M w/rflx Culture, Routine  Result Value Ref Range   Specific Gravity, UA 1.020 1.005 - 1.030   pH, UA 7.0 5.0 - 7.5   Color, UA Yellow Yellow   Appearance Ur Clear Clear   Leukocytes, UA Negative Negative   Protein, UA Negative Negative/Trace   Glucose, UA Negative Negative   Ketones, UA Negative Negative   RBC, UA Negative Negative   Bilirubin, UA Negative Negative   Urobilinogen, Ur 0.2 0.2 - 1.0 mg/dL   Nitrite, UA Negative Negative      Assessment & Plan:   Problem List Items Addressed This Visit      Cardiovascular and Mediastinum   Migraine    Under good control on current regimen. Continue current regimen. Continue to monitor. Call with any concerns. Refills given.        Relevant Medications   ketorolac (TORADOL) 10 MG tablet   SUMAtriptan (IMITREX) 100 MG tablet     Respiratory   Asthma, moderate  persistent    Under good control on current regimen. Continue current regimen. Continue to monitor. Call with any concerns. Refills given.        Relevant Medications   Fluticasone-Salmeterol (ADVAIR DISKUS) 100-50 MCG/DOSE AEPB   albuterol (PROVENTIL HFA;VENTOLIN HFA) 108 (90 Base) MCG/ACT inhaler    Other Visit Diagnoses    Routine general medical examination at a health care facility    -  Primary   Vaccines up to date. Screening labs checked today. Pap up to date. Continue diet and exercise. Call with any concerns.    Relevant Orders   CBC with Differential/Platelet   Comprehensive metabolic panel   Lipid Panel w/o Chol/HDL Ratio   TSH   UA/M w/rflx Culture, Routine (Completed)   Chronic pain of left knee       Would like to get back into see orthopedics. Referral generated today. Call with any concerns.    Relevant Medications   ketorolac (TORADOL) 10 MG tablet   Other Relevant Orders   Ambulatory referral to Orthopedic Surgery       Follow up plan: Return in about 6 months (around 01/21/2019) for follow up asthma.   LABORATORY TESTING:  - Pap smear: up to date  IMMUNIZATIONS:   - Tdap: Tetanus vaccination status reviewed: last tetanus booster within 10 years. - Influenza: Up to date - HPV: Will consider  PATIENT COUNSELING:   Advised to take 1 mg of folate supplement per day if capable of pregnancy.   Sexuality: Discussed sexually transmitted diseases, partner selection, use of condoms, avoidance of unintended pregnancy  and contraceptive alternatives.   Advised to avoid cigarette smoking.  I discussed with the patient that most people either abstain from alcohol or drink within safe limits (<=14/week and <=4 drinks/occasion for males, <=7/weeks and <= 3 drinks/occasion for females) and that the risk for alcohol disorders and other health effects rises proportionally with the number of drinks per week and how often a drinker exceeds daily limits.  Discussed  cessation/primary prevention of drug use and availability of treatment for abuse.   Diet: Encouraged to adjust caloric intake to maintain  or achieve ideal body weight, to reduce intake of dietary saturated fat and total fat, to limit sodium intake by avoiding high sodium foods and not adding table salt, and to maintain adequate dietary potassium and calcium preferably from fresh fruits, vegetables, and low-fat dairy products.    stressed the importance of regular exercise  Injury prevention: Discussed safety belts, safety helmets, smoke detector, smoking near bedding or  upholstery.   Dental health: Discussed importance of regular tooth brushing, flossing, and dental visits.    NEXT PREVENTATIVE PHYSICAL DUE IN 1 YEAR. Return in about 6 months (around 01/21/2019) for follow up asthma.

## 2018-07-24 LAB — COMPREHENSIVE METABOLIC PANEL
A/G RATIO: 2.1 (ref 1.2–2.2)
ALT: 20 IU/L (ref 0–32)
AST: 15 IU/L (ref 0–40)
Albumin: 4.4 g/dL (ref 3.5–5.5)
Alkaline Phosphatase: 52 IU/L (ref 39–117)
BUN/Creatinine Ratio: 24 — ABNORMAL HIGH (ref 9–23)
BUN: 15 mg/dL (ref 6–20)
Bilirubin Total: 0.3 mg/dL (ref 0.0–1.2)
CO2: 25 mmol/L (ref 20–29)
Calcium: 9.3 mg/dL (ref 8.7–10.2)
Chloride: 103 mmol/L (ref 96–106)
Creatinine, Ser: 0.63 mg/dL (ref 0.57–1.00)
GFR calc Af Amer: 140 mL/min/{1.73_m2} (ref >59)
GFR, EST NON AFRICAN AMERICAN: 122 mL/min/{1.73_m2} (ref >59)
Globulin, Total: 2.1 g/dL (ref 1.5–4.5)
Glucose: 78 mg/dL (ref 65–99)
Potassium: 4.5 mmol/L (ref 3.5–5.2)
Sodium: 141 mmol/L (ref 134–144)
Total Protein: 6.5 g/dL (ref 6.0–8.5)

## 2018-07-24 LAB — CBC WITH DIFFERENTIAL/PLATELET
Basophils Absolute: 0 10*3/uL (ref 0.0–0.2)
Basos: 1 %
EOS (ABSOLUTE): 0.1 10*3/uL (ref 0.0–0.4)
Eos: 2 %
HEMATOCRIT: 40.2 % (ref 34.0–46.6)
Hemoglobin: 13.1 g/dL (ref 11.1–15.9)
Immature Grans (Abs): 0 10*3/uL (ref 0.0–0.1)
Immature Granulocytes: 0 %
Lymphocytes Absolute: 1.5 10*3/uL (ref 0.7–3.1)
Lymphs: 32 %
MCH: 29.1 pg (ref 26.6–33.0)
MCHC: 32.6 g/dL (ref 31.5–35.7)
MCV: 89 fL (ref 79–97)
Monocytes Absolute: 0.4 10*3/uL (ref 0.1–0.9)
Monocytes: 8 %
NEUTROS ABS: 2.8 10*3/uL (ref 1.4–7.0)
Neutrophils: 57 %
Platelets: 228 10*3/uL (ref 150–450)
RBC: 4.5 x10E6/uL (ref 3.77–5.28)
RDW: 12.6 % (ref 11.7–15.4)
WBC: 4.8 10*3/uL (ref 3.4–10.8)

## 2018-07-24 LAB — LIPID PANEL W/O CHOL/HDL RATIO
Cholesterol, Total: 163 mg/dL (ref 100–199)
HDL: 48 mg/dL (ref >39)
LDL Calculated: 90 mg/dL (ref 0–99)
Triglycerides: 126 mg/dL (ref 0–149)
VLDL Cholesterol Cal: 25 mg/dL (ref 5–40)

## 2018-07-24 LAB — TSH: TSH: 1.5 u[IU]/mL (ref 0.450–4.500)

## 2018-08-04 DIAGNOSIS — M222X2 Patellofemoral disorders, left knee: Secondary | ICD-10-CM | POA: Diagnosis not present

## 2018-08-16 DIAGNOSIS — S42024A Nondisplaced fracture of shaft of right clavicle, initial encounter for closed fracture: Secondary | ICD-10-CM | POA: Diagnosis not present

## 2018-08-18 ENCOUNTER — Ambulatory Visit: Payer: 59 | Admitting: Physical Therapy

## 2018-08-31 DIAGNOSIS — S42024D Nondisplaced fracture of shaft of right clavicle, subsequent encounter for fracture with routine healing: Secondary | ICD-10-CM | POA: Diagnosis not present

## 2018-09-02 ENCOUNTER — Other Ambulatory Visit: Payer: Self-pay | Admitting: Family Medicine

## 2018-09-02 ENCOUNTER — Other Ambulatory Visit: Payer: Self-pay | Admitting: Unknown Physician Specialty

## 2018-09-02 MED ORDER — KETOROLAC TROMETHAMINE 10 MG PO TABS: 10.0000 mg | ORAL_TABLET | ORAL | 3 refills | Status: DC | PRN

## 2018-09-02 NOTE — Telephone Encounter (Signed)
Requested medication (s) are due for refill today: unknown amount dispensed  Requested medication (s) are on the active medication list: yes- historical medication  Last refill:  07/23/18  Future visit scheduled: yes  Notes to clinic:  Hitorical medication   Requested Prescriptions  Pending Prescriptions Disp Refills   ketorolac (TORADOL) 10 MG tablet [Pharmacy Med Name: KETOROLAC TROMETHAMINE 10 M 10 TAB] 20 tablet 2    Sig: TAKE ONE TABLET BY MOUTH EVERY 6 HOURS AS NEEDED     Analgesics:  NSAIDS Passed - 09/02/2018 10:48 AM      Passed - Cr in normal range and within 360 days    Creatinine, Ser  Date Value Ref Range Status  07/23/2018 0.63 0.57 - 1.00 mg/dL Final         Passed - HGB in normal range and within 360 days    Hemoglobin  Date Value Ref Range Status  07/23/2018 13.1 11.1 - 15.9 g/dL Final         Passed - Patient is not pregnant      Passed - Valid encounter within last 12 months    Recent Outpatient Visits          1 month ago Routine general medical examination at a health care facility   [facility] [place] P, DO   4 months ago BV (bacterial vaginosis)   [facility] Roosvelt Maser [place], [place]   7 months ago LLQ pain   [facility] Gabriel Cirri, NP   1 year ago Moderate persistent asthma without complication   [facility] Gabriel Cirri, NP   1 year ago Annual physical exam   [facility] Gabriel Cirri, NP      Future Appointments            In 4 months Laural Benes, Oralia Rud, DO [employer]

## 2018-09-22 DIAGNOSIS — S42024D Nondisplaced fracture of shaft of right clavicle, subsequent encounter for fracture with routine healing: Secondary | ICD-10-CM | POA: Diagnosis not present

## 2018-10-29 DIAGNOSIS — S42001A Fracture of unspecified part of right clavicle, initial encounter for closed fracture: Secondary | ICD-10-CM | POA: Diagnosis not present

## 2018-11-07 ENCOUNTER — Telehealth: Payer: 59 | Admitting: Family

## 2018-11-07 DIAGNOSIS — J329 Chronic sinusitis, unspecified: Secondary | ICD-10-CM

## 2018-11-07 DIAGNOSIS — B9789 Other viral agents as the cause of diseases classified elsewhere: Secondary | ICD-10-CM | POA: Diagnosis not present

## 2018-11-07 MED ORDER — FLUTICASONE PROPIONATE 50 MCG/ACT NA SUSP: 2.0000 | Freq: Every day | NASAL | 6 refills | Status: DC

## 2018-11-07 NOTE — Progress Notes (Signed)

## 2019-01-21 ENCOUNTER — Encounter: Payer: Self-pay | Admitting: Family Medicine

## 2019-01-21 ENCOUNTER — Ambulatory Visit (INDEPENDENT_AMBULATORY_CARE_PROVIDER_SITE_OTHER): Payer: 59 | Admitting: Family Medicine

## 2019-01-21 ENCOUNTER — Other Ambulatory Visit: Payer: Self-pay

## 2019-01-21 VITALS — BP 118/76 | HR 89 | Temp 98.1°F | Wt 199.0 lb

## 2019-01-21 DIAGNOSIS — G43109 Migraine with aura, not intractable, without status migrainosus: Secondary | ICD-10-CM

## 2019-01-21 DIAGNOSIS — J4540 Moderate persistent asthma, uncomplicated: Secondary | ICD-10-CM | POA: Diagnosis not present

## 2019-01-21 MED ORDER — ALBUTEROL SULFATE HFA 108 (90 BASE) MCG/ACT IN AERS: 2.0000 | INHALATION_SPRAY | Freq: Four times a day (QID) | RESPIRATORY_TRACT | 6 refills | Status: DC | PRN

## 2019-01-21 MED ORDER — SUMATRIPTAN SUCCINATE 6 MG/0.5ML ~~LOC~~ SOAJ: 6.0000 mg | Freq: Every day | SUBCUTANEOUS | 12 refills | Status: DC | PRN

## 2019-01-21 MED ORDER — KETOROLAC TROMETHAMINE 10 MG PO TABS: 10.0000 mg | ORAL_TABLET | ORAL | 3 refills | Status: DC | PRN

## 2019-01-21 MED ORDER — FLUTICASONE-SALMETEROL 100-50 MCG/DOSE IN AEPB: INHALATION_SPRAY | RESPIRATORY_TRACT | 6 refills | Status: DC

## 2019-01-21 MED ORDER — SPIRIVA RESPIMAT 2.5 MCG/ACT IN AERS: 1.0000 | INHALATION_SPRAY | Freq: Every day | RESPIRATORY_TRACT | 0 refills | Status: DC

## 2019-01-21 MED ORDER — PROMETHAZINE HCL 25 MG PO TABS: 25.0000 mg | ORAL_TABLET | Freq: Three times a day (TID) | ORAL | 6 refills | Status: DC | PRN

## 2019-01-21 NOTE — Progress Notes (Signed)
BP 118/76   Pulse 89   Temp 98.1 F (36.7 C) (Oral)   Wt 199 lb (90.3 kg)   SpO2 95%   BMI 30.71 kg/m    Subjective:    Patient ID: Miranda BrothersAmelia Deroo, female    DOB: 04/29/1991, 28 y.o.   MRN: [id number]  HPI: Miranda Mcdonald is a 28 y.o. female  Chief Complaint  Patient presents with  . Follow-up    Asthma about the same. Due to constant mask and humidity.   . Medication Refill    Imitrex injection and Advair   ASTHMA Asthma status: stable Satisfied with current treatment?: yes Albuterol/rescue inhaler frequency: 4x in 2 weeks Dyspnea frequency: going in and out of air conditioning repeatedly Wheezing frequency: going in and out of air conditioning repeatedly Cough frequency: going in and out of air conditioning repeatedly Nocturnal symptom frequency: very occasionally Limitation of activity: no Current upper respiratory symptoms: no Triggers: humidity Aerochamber/spacer use: no Visits to ER or Urgent Care in past year: no Pneumovax: Not up to Date Influenza: Up to Date  MIGRAINES- varies a lot. Has figured out a lot of her triggers, includes stress and sleep Duration: years Onset: sudden Severity: severe Quality: sharp Frequency: intermittent Location: behind her eyes Headache duration: hours Radiation: no Headache status at time of visit: asymptomatic Treatments attempted: Treatments attempted: rest, ice, heat, APAP, ibuprofen and triptans   Aura: yes Nausea:  yes Vomiting: yes Photophobia:  yes Phonophobia:  yes Effect on social functioning:  yes Confusion:  no Gait disturbance/ataxia:  no Behavioral changes:  no Fevers:  no  Relevant past medical, surgical, family and social history reviewed and updated as indicated. Interim medical history since our last visit reviewed. Allergies and medications reviewed and updated.  Review of Systems  Constitutional: Negative.   Respiratory: Positive for cough and wheezing. Negative for apnea, choking, chest  tightness, shortness of breath and stridor.   Cardiovascular: Negative.   Gastrointestinal: Negative.   Psychiatric/Behavioral: Negative.     Per HPI unless specifically indicated above     Objective:    BP 118/76   Pulse 89   Temp 98.1 F (36.7 C) (Oral)   Wt 199 lb (90.3 kg)   SpO2 95%   BMI 30.71 kg/m   Wt Readings from Last 3 Encounters:  01/21/19 199 lb (90.3 kg)  07/23/18 194 lb (88 kg)  05/05/18 195 lb (88.5 kg)    Physical Exam Vitals signs and nursing note reviewed.  Constitutional:      General: She is not in acute distress.    Appearance: Normal appearance. She is not ill-appearing, toxic-appearing or diaphoretic.  HENT:     Head: Normocephalic and atraumatic.     Right Ear: External ear normal.     Left Ear: External ear normal.     Nose: Nose normal.     Mouth/Throat:     Mouth: Mucous membranes are moist.     Pharynx: Oropharynx is clear.  Eyes:     General: No scleral icterus.       Right eye: No discharge.        Left eye: No discharge.     Extraocular Movements: Extraocular movements intact.     Conjunctiva/sclera: Conjunctivae normal.     Pupils: Pupils are equal, round, and reactive to light.  Neck:     Musculoskeletal: Normal range of motion and neck supple.  Cardiovascular:     Rate and Rhythm: Normal rate and regular rhythm.  Pulses: Normal pulses.     Heart sounds: Normal heart sounds. No murmur. No friction rub. No gallop.   Pulmonary:     Effort: Pulmonary effort is normal. No respiratory distress.     Breath sounds: Normal breath sounds. No stridor. No wheezing, rhonchi or rales.  Chest:     Chest wall: No tenderness.  Musculoskeletal: Normal range of motion.  Skin:    General: Skin is warm and dry.     Capillary Refill: Capillary refill takes less than 2 seconds.     Coloration: Skin is not jaundiced or pale.     Findings: No bruising, erythema, lesion or rash.  Neurological:     General: No focal deficit present.     Mental  Status: She is alert and oriented to person, place, and time. Mental status is at baseline.  Psychiatric:        Mood and Affect: Mood normal.        Behavior: Behavior normal.        Thought Content: Thought content normal.        Judgment: Judgment normal.     Results for orders placed or performed in visit on 07/23/18  CBC with Differential/Platelet  Result Value Ref Range   WBC 4.8 3.4 - 10.8 x10E3/uL   RBC 4.50 3.77 - 5.28 x10E6/uL   Hemoglobin 13.1 11.1 - 15.9 g/dL   Hematocrit 40.2 34.0 - 46.6 %   MCV 89 79 - 97 fL   MCH 29.1 26.6 - 33.0 pg   MCHC 32.6 31.5 - 35.7 g/dL   RDW 12.6 11.7 - 15.4 %   Platelets 228 150 - 450 x10E3/uL   Neutrophils 57 Not Estab. %   Lymphs 32 Not Estab. %   Monocytes 8 Not Estab. %   Eos 2 Not Estab. %   Basos 1 Not Estab. %   Neutrophils Absolute 2.8 1.4 - 7.0 x10E3/uL   Lymphocytes Absolute 1.5 0.7 - 3.1 x10E3/uL   Monocytes Absolute 0.4 0.1 - 0.9 x10E3/uL   EOS (ABSOLUTE) 0.1 0.0 - 0.4 x10E3/uL   Basophils Absolute 0.0 0.0 - 0.2 x10E3/uL   Immature Granulocytes 0 Not Estab. %   Immature Grans (Abs) 0.0 0.0 - 0.1 x10E3/uL  Comprehensive metabolic panel  Result Value Ref Range   Glucose 78 65 - 99 mg/dL   BUN 15 6 - 20 mg/dL   Creatinine, Ser 0.63 0.57 - 1.00 mg/dL   GFR calc non Af Amer 122 >59 mL/min/1.73   GFR calc Af Amer 140 >59 mL/min/1.73   BUN/Creatinine Ratio 24 (H) 9 - 23   Sodium 141 134 - 144 mmol/L   Potassium 4.5 3.5 - 5.2 mmol/L   Chloride 103 96 - 106 mmol/L   CO2 25 20 - 29 mmol/L   Calcium 9.3 8.7 - 10.2 mg/dL   Total Protein 6.5 6.0 - 8.5 g/dL   Albumin 4.4 3.5 - 5.5 g/dL   Globulin, Total 2.1 1.5 - 4.5 g/dL   Albumin/Globulin Ratio 2.1 1.2 - 2.2   Bilirubin Total 0.3 0.0 - 1.2 mg/dL   Alkaline Phosphatase 52 39 - 117 IU/L   AST 15 0 - 40 IU/L   ALT 20 0 - 32 IU/L  Lipid Panel w/o Chol/HDL Ratio  Result Value Ref Range   Cholesterol, Total 163 100 - 199 mg/dL   Triglycerides 126 0 - 149 mg/dL   HDL 48 >39  mg/dL   VLDL Cholesterol Cal 25 5 - 40 mg/dL  LDL Calculated 90 0 - 99 mg/dL  TSH  Result Value Ref Range   TSH 1.500 0.450 - 4.500 uIU/mL  UA/M w/rflx Culture, Routine   Specimen: Urine   URINE  Result Value Ref Range   Specific Gravity, UA 1.020 1.005 - 1.030   pH, UA 7.0 5.0 - 7.5   Color, UA Yellow Yellow   Appearance Ur Clear Clear   Leukocytes, UA Negative Negative   Protein, UA Negative Negative/Trace   Glucose, UA Negative Negative   Ketones, UA Negative Negative   RBC, UA Negative Negative   Bilirubin, UA Negative Negative   Urobilinogen, Ur 0.2 0.2 - 1.0 mg/dL   Nitrite, UA Negative Negative      Assessment & Plan:   Problem List Items Addressed This Visit      Cardiovascular and Mediastinum   Migraine - Primary    Under good control on current regimen. Continue current regimen. Continue to monitor. Call with any concerns. Refills given.        Relevant Medications   SUMAtriptan 6 MG/0.5ML SOAJ   ketorolac (TORADOL) 10 MG tablet     Respiratory   Asthma, moderate persistent    In slight exacerbation due to the humidity. Will add spiriva for the summer. Continue to monitor. Call with any concerns.       Relevant Medications   Fluticasone-Salmeterol (ADVAIR DISKUS) 100-50 MCG/DOSE AEPB   albuterol (VENTOLIN HFA) 108 (90 Base) MCG/ACT inhaler   Tiotropium Bromide Monohydrate (SPIRIVA RESPIMAT) 2.5 MCG/ACT AERS       Follow up plan: Return in about 6 months (around 07/24/2019) for Physical.

## 2019-01-23 ENCOUNTER — Encounter: Payer: Self-pay | Admitting: Family Medicine

## 2019-01-23 NOTE — Assessment & Plan Note (Signed)
Under good control on current regimen. Continue current regimen. Continue to monitor. Call with any concerns. Refills given.   

## 2019-01-23 NOTE — Assessment & Plan Note (Signed)
In slight exacerbation due to the humidity. Will add spiriva for the summer. Continue to monitor. Call with any concerns.

## 2019-02-13 ENCOUNTER — Other Ambulatory Visit: Payer: Self-pay

## 2019-02-13 ENCOUNTER — Emergency Department: Payer: 59

## 2019-02-13 ENCOUNTER — Emergency Department
Admission: EM | Admit: 2019-02-13 | Discharge: 2019-02-13 | Disposition: A | Discharge status: Home / Self Care | Payer: 59 | Attending: Emergency Medicine | Admitting: Emergency Medicine

## 2019-02-13 DIAGNOSIS — S62002A Unspecified fracture of navicular [scaphoid] bone of left wrist, initial encounter for closed fracture: Secondary | ICD-10-CM | POA: Diagnosis not present

## 2019-02-13 DIAGNOSIS — Y92009 Unspecified place in unspecified non-institutional (private) residence as the place of occurrence of the external cause: Secondary | ICD-10-CM | POA: Diagnosis not present

## 2019-02-13 DIAGNOSIS — W19XXXA Unspecified fall, initial encounter: Secondary | ICD-10-CM | POA: Diagnosis not present

## 2019-02-13 DIAGNOSIS — M79632 Pain in left forearm: Secondary | ICD-10-CM | POA: Diagnosis not present

## 2019-02-13 DIAGNOSIS — S6992XA Unspecified injury of left wrist, hand and finger(s), initial encounter: Secondary | ICD-10-CM | POA: Diagnosis not present

## 2019-02-13 DIAGNOSIS — J45909 Unspecified asthma, uncomplicated: Secondary | ICD-10-CM | POA: Diagnosis not present

## 2019-02-13 DIAGNOSIS — Y939 Activity, unspecified: Secondary | ICD-10-CM | POA: Diagnosis not present

## 2019-02-13 DIAGNOSIS — S59912A Unspecified injury of left forearm, initial encounter: Secondary | ICD-10-CM | POA: Diagnosis not present

## 2019-02-13 DIAGNOSIS — M25532 Pain in left wrist: Secondary | ICD-10-CM | POA: Diagnosis not present

## 2019-02-13 DIAGNOSIS — Y999 Unspecified external cause status: Secondary | ICD-10-CM | POA: Insufficient documentation

## 2019-02-13 MED ORDER — ONDANSETRON HCL 4 MG PO TABS: 4.0000 mg | ORAL_TABLET | Freq: Three times a day (TID) | ORAL | 0 refills | Status: AC | PRN

## 2019-02-13 MED ORDER — TRAMADOL HCL 50 MG PO TABS: 50.0000 mg | ORAL_TABLET | Freq: Four times a day (QID) | ORAL | 0 refills | Status: AC | PRN

## 2019-02-13 NOTE — ED Triage Notes (Signed)
Pt states she was running and tripped over her loose shoelace injuring left arm. Pt with abrasions to bilateral knees, palms. Pt complains of left forearm pain, left wrist pain. Pt with sling in place. Pt states she did have 4 shots of rum with injury.

## 2019-02-13 NOTE — ED Provider Notes (Signed)
[facility] Emergency Department Provider Note  ____________________________________________  Time seen: Approximately 11:35 PM  I have reviewed the triage vital signs and the nursing notes.   HISTORY  Chief Complaint No chief complaint on file.    HPI Miranda Mcdonald is a 28 y.o. female presents to the emergency department with acute left wrist pain and bilateral knee abrasions.  Patient states that she was enjoying a night at home with drinks and and experienced a mechanical, non-syncopal fall.  She did not hit her head or her neck.  Injury was witnessed.  No numbness or tingling in the left wrist.  She denies chest pain, chest tightness, shortness of breath or abdominal pain.  No other alleviating measures have been attempted.        Past Medical History:  Diagnosis Date  . Asthma   . Heart palpitations 03/19/2015  . Hypoglycemia   . Lactose intolerance in adult   . Migraines     Patient Active Problem List   Diagnosis Date Noted  . History of abnormal Pap smear 01/18/2018  . Migraine 02/22/2016  . Asthma, moderate persistent 01/14/2016    Past Surgical History:  Procedure Laterality Date  . CLAVICLE SURGERY Left 2007  . CLAVICLE SURGERY     surgery to remove pin from first surgery  . KNEE SURGERY Left   . WISDOM TOOTH EXTRACTION      Prior to Admission medications   Medication Sig Start Date End Date Taking? Authorizing Provider  albuterol (VENTOLIN HFA) 108 (90 Base) MCG/ACT inhaler Inhale 2 puffs into the lungs every 6 (six) hours as needed for wheezing or shortness of breath. 01/21/19   Johnson, Megan P, DO  fluticasone (FLONASE) 50 MCG/ACT nasal spray Place 2 sprays into both nostrils daily. 11/07/18   Jannifer RodneyHawks, Christy A, FNP  Fluticasone-Salmeterol (ADVAIR DISKUS) 100-50 MCG/DOSE AEPB INHALE 1 PUFF INTO THE LUNGS 2 TIMES DAILY. 01/21/19   Johnson, Megan P, DO  ketorolac (TORADOL) 10 MG tablet Take 1 tablet (10 mg total) by mouth as needed.  01/21/19   Johnson, Megan P, DO  ondansetron (ZOFRAN) 4 MG tablet Take 1 tablet (4 mg total) by mouth every 8 (eight) hours as needed for up to 5 days for nausea or vomiting. 02/13/19 02/18/19  Orvil FeilWoods, Jaclyn M, PA-C  promethazine (PHENERGAN) 25 MG tablet Take 1 tablet (25 mg total) by mouth every 8 (eight) hours as needed for nausea or vomiting. 01/21/19   Laural BenesJohnson, Megan P, DO  SUMAtriptan (IMITREX) 100 MG tablet Take 1 tablet (100 mg total) by mouth every 2 (two) hours as needed for migraine. May repeat in 2 hours if headache persists or recurs. 07/23/18   Johnson, Megan P, DO  SUMAtriptan 6 MG/0.5ML SOAJ Inject 6 mg into the skin daily as needed. For migraine 01/21/19   Johnson, Megan P, DO  Tiotropium Bromide Monohydrate (SPIRIVA RESPIMAT) 2.5 MCG/ACT AERS Inhale 1 puff into the lungs daily. 01/21/19   Johnson, Megan P, DO  traMADol (ULTRAM) 50 MG tablet Take 1 tablet (50 mg total) by mouth every 6 (six) hours as needed for up to 3 days. 02/13/19 02/16/19  Orvil FeilWoods, Jaclyn M, PA-C    Allergies Morphine and related and Percocet [oxycodone-acetaminophen]  Family History  Problem Relation Age of Onset  . Bipolar disorder Mother   . Hypothyroidism Mother   . Restless legs syndrome Mother   . Neuropathy Father   . Depression Father   . Bipolar disorder Sister   . Hypothyroidism Sister   .  Bipolar disorder Brother   . Heart murmur Daughter   . Speech disorder Daughter   . Arthritis Maternal Grandmother   . Hypotension Maternal Grandmother   . Diabetes Maternal Grandfather   . Depression Maternal Grandfather   . Hyperlipidemia Maternal Grandfather   . Liver disease Maternal Grandfather        fatty liver  . Bipolar disorder Maternal Grandfather   . Hypothyroidism Maternal Grandfather   . Hypertension Paternal Grandmother   . Depression Paternal Grandmother   . Depression Paternal Grandfather   . Breast cancer Maternal Aunt   . Cancer Maternal Aunt        breast  . Breast cancer Other   . Breast  cancer Other     Social History Social History   Tobacco Use  . Smoking status: Never Smoker  . Smokeless tobacco: Never Used  Substance Use Topics  . Alcohol use: Yes    Comment: occasional- pt states once in a while  . Drug use: No     Review of Systems  Constitutional: No fever/chills Eyes: No visual changes. No discharge ENT: No upper respiratory complaints. Cardiovascular: no chest pain. Respiratory: no cough. No SOB. Gastrointestinal: No abdominal pain.  No nausea, no vomiting.  No diarrhea.  No constipation. Musculoskeletal: Patient has left wrist pain. Skin: Negative for rash, abrasions, lacerations, ecchymosis. Neurological: Negative for headaches, focal weakness or numbness.   ____________________________________________   PHYSICAL EXAM:  VITAL SIGNS: ED Triage Vitals  Enc Vitals Group     BP 02/13/19 2124 121/82     Pulse Rate 02/13/19 2124 (!) 102     Resp 02/13/19 2124 16     Temp --      Temp Source 02/13/19 2124 Oral     SpO2 02/13/19 2124 99 %     Weight 02/13/19 2125 200 lb (90.7 kg)     Height 02/13/19 2125 5\' 9"  (1.753 m)     Head Circumference --      Peak Flow --      Pain Score 02/13/19 2125 10     Pain Loc --      Pain Edu? --      Excl. in GC? --      Constitutional: Alert and oriented. Well appearing and in no acute distress. Eyes: Conjunctivae are normal. PERRL. EOMI. Head: Atraumatic. ENT:      Nose: No congestion/rhinnorhea.      Mouth/Throat: Mucous membranes are moist.  Neck: Full range of motion. Cardiovascular: Normal rate, regular rhythm. Normal S1 and S2.  Good peripheral circulation. Respiratory: Normal respiratory effort without tachypnea or retractions. Lungs CTAB. Good air entry to the bases with no decreased or absent breath sounds. Musculoskeletal: Patient is unable to perform full range of motion at the left wrist, likely secondary to pain.  She is able to move all 5 left fingers.  Palpable radial pulse,  left. Neurologic:  Normal speech and language. No gross focal neurologic deficits are appreciated.  Skin: Patient has abrasions of the bilateral knees.    ____________________________________________   LABS (all labs ordered are listed, but only abnormal results are displayed)  Labs Reviewed - No data to display ____________________________________________  EKG   ____________________________________________  RADIOLOGY I personally viewed and evaluated these images as part of my medical decision making, as well as reviewing the written report by the radiologist.  Dg Forearm Left  Result Date: 02/13/2019 CLINICAL DATA:  Pain status post fall EXAM: LEFT FOREARM - 2 VIEW COMPARISON:  None. FINDINGS: There is no evidence of fracture or other focal bone lesions. Soft tissues are unremarkable. IMPRESSION: No displaced fracture involving the radius or ulna. Electronically Signed   By: Constance Holster M.D.   On: 02/13/2019 21:57   Dg Wrist Complete Left  Result Date: 02/13/2019 CLINICAL DATA:  Pain EXAM: LEFT WRIST - COMPLETE 3+ VIEW COMPARISON:  None. FINDINGS: Findings are concerning for a nondisplaced fracture through the waist of the scaphoid. There is some mild soft tissue swelling about the wrist. There is no dislocation. IMPRESSION: Overall findings suspicious for a nondisplaced fracture through the waist of the scaphoid. As this is only visualized on a single view, repeat radiographs are recommended in 10-14 days for confirmation of this finding. Electronically Signed   By: Constance Holster M.D.   On: 02/13/2019 21:57    ____________________________________________    PROCEDURES  Procedure(s) performed:    Procedures    Medications - No data to display   ____________________________________________   INITIAL IMPRESSION / ASSESSMENT AND PLAN / ED COURSE  Pertinent labs & imaging results that were available during my care of the patient were reviewed by me and  considered in my medical decision making (see chart for details).  Review of the [place] CSRS was performed in accordance of the [place] prior to dispensing any controlled drugs.           Assessment and plan Fall 28 year old female presents to the emergency department after a mechanical, non-syncopal fall.  Patient is mildly tachycardic at triage but vital signs are otherwise reassuring.  Patient is unable to perform full range of motion at the left wrist.  X-ray examination is concerning for a scaphoid fracture.  Patient was placed in a Velcro wrist splint in the emergency department and was advised to follow-up with Dr. Peggye Ley.  Patient stated that tramadol had worked well for her in the past for pain.  She was discharged with tramadol.  Strict return precautions were given to return to the emergency department for new or worsening symptoms.  All patient questions were answered.   ____________________________________________  FINAL CLINICAL IMPRESSION(S) / ED DIAGNOSES  Final diagnoses:  Closed nondisplaced fracture of scaphoid of left wrist, unspecified portion of scaphoid, initial encounter      NEW MEDICATIONS STARTED DURING THIS VISIT:  ED Discharge Orders         Ordered    traMADol (ULTRAM) 50 MG tablet  Every 6 hours PRN     02/13/19 2316    ondansetron (ZOFRAN) 4 MG tablet  Every 8 hours PRN     02/13/19 2317              This chart was dictated using voice recognition software/Dragon. Despite best efforts to proofread, errors can occur which can change the meaning. Any change was purely unintentional.    Lannie Fields, PA-C 02/13/19 2339    Harvest Dark, MD 02/16/19 [phone]

## 2019-02-15 DIAGNOSIS — S52125A Nondisplaced fracture of head of left radius, initial encounter for closed fracture: Secondary | ICD-10-CM | POA: Diagnosis not present

## 2019-02-22 DIAGNOSIS — S52125A Nondisplaced fracture of head of left radius, initial encounter for closed fracture: Secondary | ICD-10-CM | POA: Diagnosis not present

## 2019-02-22 DIAGNOSIS — M25532 Pain in left wrist: Secondary | ICD-10-CM | POA: Diagnosis not present

## 2019-03-08 DIAGNOSIS — S52125A Nondisplaced fracture of head of left radius, initial encounter for closed fracture: Secondary | ICD-10-CM | POA: Diagnosis not present

## 2019-03-29 DIAGNOSIS — S52125A Nondisplaced fracture of head of left radius, initial encounter for closed fracture: Secondary | ICD-10-CM | POA: Diagnosis not present

## 2019-04-15 ENCOUNTER — Telehealth: Payer: Self-pay | Admitting: Family Medicine

## 2019-04-15 MED ORDER — SPIRIVA RESPIMAT 2.5 MCG/ACT IN AERS: 1.0000 | INHALATION_SPRAY | Freq: Every day | RESPIRATORY_TRACT | 6 refills | Status: DC

## 2019-04-15 NOTE — Telephone Encounter (Signed)
Medication Refill - Medication: Tiotropium Bromide Monohydrate (SPIRIVA RESPIMAT) 2.5 MCG/ACT AERS [   Has the patient contacted their pharmacy? Yes.   (Agent: If no, request that the patient contact the pharmacy for the refill.) (Agent: If yes, when and what did the pharmacy advise?)  Preferred Pharmacy (with phone number or street name):  [place] [place]  [place] [address]  Phone: [phone] Fax: [phone]     Agent: Please be advised that RX refills may take up to 3 business days. We ask that you follow-up with your pharmacy.

## 2019-05-16 ENCOUNTER — Encounter: Payer: Self-pay | Admitting: Family Medicine

## 2019-05-16 ENCOUNTER — Ambulatory Visit (INDEPENDENT_AMBULATORY_CARE_PROVIDER_SITE_OTHER): Payer: 59 | Admitting: Family Medicine

## 2019-05-16 ENCOUNTER — Ambulatory Visit: Payer: Self-pay | Admitting: Family Medicine

## 2019-05-16 ENCOUNTER — Encounter: Payer: 59 | Admitting: Family Medicine

## 2019-05-16 ENCOUNTER — Other Ambulatory Visit: Payer: Self-pay

## 2019-05-16 DIAGNOSIS — R0789 Other chest pain: Secondary | ICD-10-CM

## 2019-05-16 DIAGNOSIS — M62838 Other muscle spasm: Secondary | ICD-10-CM

## 2019-05-16 MED ORDER — CYCLOBENZAPRINE HCL 10 MG PO TABS: 10.0000 mg | ORAL_TABLET | Freq: Every day | ORAL | 2 refills | Status: DC

## 2019-05-16 MED ORDER — DICLOFENAC SODIUM 1 % TD GEL: 4.0000 g | Freq: Four times a day (QID) | TRANSDERMAL | 3 refills | Status: DC

## 2019-05-16 NOTE — Progress Notes (Signed)
There were no vitals taken for this visit.   Subjective:    Patient ID: Miranda Mcdonald, female    DOB: Sep 18, 1990, 28 y.o.   MRN: [id number]  HPI: Miranda Mcdonald is a 28 y.o. female  Chief Complaint  Patient presents with  . Spasms   Has been having muscle spasms in her chest and near her ribs. She is not sure if its costocondritis or panic attacks. It's been going on for about 2 days and she felt like her heart was racing, but her pulse was normal. She has also been feeling tight at the base of her throat into her first 2 ribs that would get tight and the ease up. She notes that she has been under a lot of of extra stress recently . She notes that they seem to come on at night, but they also happen in the AM- so at random. Does not seem to be coming on with any precipitating factors. She is otherwise doing OK with no other concerns or complaints at this time.   Depression screen [facility] 2/9 05/16/2019 07/23/2018 03/04/2017 02/22/2016  Decreased Interest 0 0 0 0  Down, Depressed, Hopeless 1 0 0 0  PHQ - 2 Score 1 0 0 0  Altered sleeping _0 -  Tired, decreased energy 1 1 0 -  Change in appetite 2 1 0 -  Feeling bad or failure about yourself  2 0 0 -  Trouble concentrating 3 0 1 -  Moving slowly or fidgety/restless 0 0 0 -  Suicidal thoughts - 0 0 -  PHQ-9 Score _1 -  Difficult doing work/chores Somewhat difficult Not difficult at all - -   GAD 7 : Generalized Anxiety Score 05/16/2019 07/23/2018  Nervous, Anxious, on Edge 2 1  Control/stop worrying 1 0  Worry too much - different things 1 1  Trouble relaxing 1 1  Restless 1 1  Easily annoyed or irritable 2 1  Afraid - awful might happen 0 0  Total GAD 7 Score 8 5  Anxiety Difficulty Somewhat difficult Not difficult at all    Relevant past medical, surgical, family and social history reviewed and updated as indicated. Interim medical history since our last visit reviewed. Allergies and medications reviewed and updated.  Review of  Systems  Constitutional: Negative.   Respiratory: Negative.   Cardiovascular: Negative.   Gastrointestinal: Negative.   Musculoskeletal: Positive for myalgias. Negative for arthralgias, back pain, gait problem, joint swelling, neck pain and neck stiffness.  Skin: Negative.   Neurological: Negative.   Psychiatric/Behavioral: Negative for agitation, behavioral problems, confusion, decreased concentration, dysphoric mood, hallucinations, self-injury, sleep disturbance and suicidal ideas. The patient is nervous/anxious. The patient is not hyperactive.     Per HPI unless specifically indicated above     Objective:    There were no vitals taken for this visit.  Wt Readings from Last 3 Encounters:  02/13/19 200 lb (90.7 kg)  01/21/19 199 lb (90.3 kg)  07/23/18 194 lb (88 kg)    Physical Exam Vitals signs and nursing note reviewed.  Pulmonary:     Effort: Pulmonary effort is normal. No respiratory distress.     Comments: Speaking in full sentences Neurological:     Mental Status: She is alert.  Psychiatric:        Mood and Affect: Mood normal.        Behavior: Behavior normal.        Thought Content: Thought content normal.  Judgment: Judgment normal.     Results for orders placed or performed in visit on 07/23/18  CBC with Differential/Platelet  Result Value Ref Range   WBC 4.8 3.4 - 10.8 x10E3/uL   RBC 4.50 3.77 - 5.28 x10E6/uL   Hemoglobin 13.1 11.1 - 15.9 g/dL   Hematocrit 40.2 34.0 - 46.6 %   MCV 89 79 - 97 fL   MCH 29.1 26.6 - 33.0 pg   MCHC 32.6 31.5 - 35.7 g/dL   RDW 12.6 11.7 - 15.4 %   Platelets 228 150 - 450 x10E3/uL   Neutrophils 57 Not Estab. %   Lymphs 32 Not Estab. %   Monocytes 8 Not Estab. %   Eos 2 Not Estab. %   Basos 1 Not Estab. %   Neutrophils Absolute 2.8 1.4 - 7.0 x10E3/uL   Lymphocytes Absolute 1.5 0.7 - 3.1 x10E3/uL   Monocytes Absolute 0.4 0.1 - 0.9 x10E3/uL   EOS (ABSOLUTE) 0.1 0.0 - 0.4 x10E3/uL   Basophils Absolute 0.0 0.0 - 0.2  x10E3/uL   Immature Granulocytes 0 Not Estab. %   Immature Grans (Abs) 0.0 0.0 - 0.1 x10E3/uL  Comprehensive metabolic panel  Result Value Ref Range   Glucose 78 65 - 99 mg/dL   BUN 15 6 - 20 mg/dL   Creatinine, Ser 0.63 0.57 - 1.00 mg/dL   GFR calc non Af Amer 122 >59 mL/min/1.73   GFR calc Af Amer 140 >59 mL/min/1.73   BUN/Creatinine Ratio 24 (H) 9 - 23   Sodium 141 134 - 144 mmol/L   Potassium 4.5 3.5 - 5.2 mmol/L   Chloride 103 96 - 106 mmol/L   CO2 25 20 - 29 mmol/L   Calcium 9.3 8.7 - 10.2 mg/dL   Total Protein 6.5 6.0 - 8.5 g/dL   Albumin 4.4 3.5 - 5.5 g/dL   Globulin, Total 2.1 1.5 - 4.5 g/dL   Albumin/Globulin Ratio 2.1 1.2 - 2.2   Bilirubin Total 0.3 0.0 - 1.2 mg/dL   Alkaline Phosphatase 52 39 - 117 IU/L   AST 15 0 - 40 IU/L   ALT 20 0 - 32 IU/L  Lipid Panel w/o Chol/HDL Ratio  Result Value Ref Range   Cholesterol, Total 163 100 - 199 mg/dL   Triglycerides 126 0 - 149 mg/dL   HDL 48 >39 mg/dL   VLDL Cholesterol Cal 25 5 - 40 mg/dL   LDL Calculated 90 0 - 99 mg/dL  TSH  Result Value Ref Range   TSH 1.500 0.450 - 4.500 uIU/mL  UA/M w/rflx Culture, Routine   Specimen: Urine   URINE  Result Value Ref Range   Specific Gravity, UA 1.020 1.005 - 1.030   pH, UA 7.0 5.0 - 7.5   Color, UA Yellow Yellow   Appearance Ur Clear Clear   Leukocytes, UA Negative Negative   Protein, UA Negative Negative/Trace   Glucose, UA Negative Negative   Ketones, UA Negative Negative   RBC, UA Negative Negative   Bilirubin, UA Negative Negative   Urobilinogen, Ur 0.2 0.2 - 1.0 mg/dL   Nitrite, UA Negative Negative      Assessment & Plan:   Problem List Items Addressed This Visit    None    Visit Diagnoses    Chest tightness    -  Primary   Costocondritis vs anxiety. Will treat with voltaren and flexeril for a couple of days. If not getting better, will start hydroxyzine. Call with any concerns.    Muscle spasm  Will check labs to look for electrolyte imbalance. Await  results. Call with any concerns.    Relevant Orders   CBC with Differential OUT   Comp Met (CMET)   Magnesium   Phosphorus   Vit D  25 hydroxy (rtn osteoporosis monitoring)       Follow up plan: Return if symptoms worsen or fail to improve.    . This visit was completed via telephone due to the restrictions of the COVID-19 pandemic. All issues as above were discussed and addressed but no physical exam was performed. If it was felt that the patient should be evaluated in the office, they were directed there. The patient verbally consented to this visit. Patient was unable to complete an audio/visual visit due to Lack of equipment. Due to the catastrophic nature of the COVID-19 pandemic, this visit was done through audio contact only. . Location of the patient: parking lot . Location of the provider: home . Those involved with this call:  . Provider: Park Liter, DO . CMA: Tiffany Reel, CMA . Front Desk/Registration: Don Perking  . Time spent on call: 21 minutes on the phone discussing health concerns. 23 minutes total spent in review of patient's record and preparation of their chart.

## 2019-05-19 ENCOUNTER — Other Ambulatory Visit: Payer: Self-pay

## 2019-05-19 ENCOUNTER — Other Ambulatory Visit: Payer: 59

## 2019-05-19 DIAGNOSIS — M62838 Other muscle spasm: Secondary | ICD-10-CM | POA: Diagnosis not present

## 2019-05-20 LAB — MAGNESIUM: Magnesium: 2.1 mg/dL (ref 1.6–2.3)

## 2019-05-20 LAB — CBC WITH DIFFERENTIAL/PLATELET
Basophils Absolute: 0.1 10*3/uL (ref 0.0–0.2)
Basos: 1 %
EOS (ABSOLUTE): 0.1 10*3/uL (ref 0.0–0.4)
Eos: 1 %
Hematocrit: 41.5 % (ref 34.0–46.6)
Hemoglobin: 13.8 g/dL (ref 11.1–15.9)
Immature Grans (Abs): 0 10*3/uL (ref 0.0–0.1)
Immature Granulocytes: 0 %
Lymphocytes Absolute: 1.7 10*3/uL (ref 0.7–3.1)
Lymphs: 27 %
MCH: 28.9 pg (ref 26.6–33.0)
MCHC: 33.3 g/dL (ref 31.5–35.7)
MCV: 87 fL (ref 79–97)
Monocytes Absolute: 0.5 10*3/uL (ref 0.1–0.9)
Monocytes: 8 %
Neutrophils Absolute: 3.8 10*3/uL (ref 1.4–7.0)
Neutrophils: 63 %
Platelets: 251 10*3/uL (ref 150–450)
RBC: 4.77 x10E6/uL (ref 3.77–5.28)
RDW: 12.3 % (ref 11.7–15.4)
WBC: 6 10*3/uL (ref 3.4–10.8)

## 2019-05-20 LAB — COMPREHENSIVE METABOLIC PANEL
ALT: 9 IU/L (ref 0–32)
AST: 14 IU/L (ref 0–40)
Albumin/Globulin Ratio: 2 (ref 1.2–2.2)
Albumin: 4.5 g/dL (ref 3.9–5.0)
Alkaline Phosphatase: 60 IU/L (ref 39–117)
BUN/Creatinine Ratio: 14 (ref 9–23)
BUN: 11 mg/dL (ref 6–20)
Bilirubin Total: 0.3 mg/dL (ref 0.0–1.2)
CO2: 22 mmol/L (ref 20–29)
Calcium: 9.5 mg/dL (ref 8.7–10.2)
Chloride: 102 mmol/L (ref 96–106)
Creatinine, Ser: 0.8 mg/dL (ref 0.57–1.00)
GFR calc Af Amer: 114 mL/min/{1.73_m2} (ref >59)
GFR calc non Af Amer: 99 mL/min/{1.73_m2} (ref >59)
Globulin, Total: 2.2 g/dL (ref 1.5–4.5)
Glucose: 90 mg/dL (ref 65–99)
Potassium: 4.3 mmol/L (ref 3.5–5.2)
Sodium: 140 mmol/L (ref 134–144)
Total Protein: 6.7 g/dL (ref 6.0–8.5)

## 2019-05-20 LAB — PHOSPHORUS: Phosphorus: 3.3 mg/dL (ref 3.0–4.3)

## 2019-05-20 LAB — VITAMIN D 25 HYDROXY (VIT D DEFICIENCY, FRACTURES): Vit D, 25-Hydroxy: 25.3 ng/mL — ABNORMAL LOW (ref 30.0–100.0)
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
## 2019-08-02 ENCOUNTER — Ambulatory Visit (INDEPENDENT_AMBULATORY_CARE_PROVIDER_SITE_OTHER): Payer: 59 | Admitting: Family Medicine

## 2019-08-02 ENCOUNTER — Other Ambulatory Visit: Payer: Self-pay

## 2019-08-02 ENCOUNTER — Encounter: Payer: Self-pay | Admitting: Family Medicine

## 2019-08-02 VITALS — BP 107/68 | HR 75 | Temp 98.2°F | Ht 68.0 in | Wt 214.0 lb

## 2019-08-02 DIAGNOSIS — F419 Anxiety disorder, unspecified: Secondary | ICD-10-CM

## 2019-08-02 DIAGNOSIS — Z0000 Encounter for general adult medical examination without abnormal findings: Secondary | ICD-10-CM | POA: Diagnosis not present

## 2019-08-02 DIAGNOSIS — G43109 Migraine with aura, not intractable, without status migrainosus: Secondary | ICD-10-CM | POA: Diagnosis not present

## 2019-08-02 DIAGNOSIS — J4540 Moderate persistent asthma, uncomplicated: Secondary | ICD-10-CM

## 2019-08-02 LAB — UA/M W/RFLX CULTURE, ROUTINE
Bilirubin, UA: NEGATIVE
Glucose, UA: NEGATIVE
Ketones, UA: NEGATIVE
Leukocytes,UA: NEGATIVE
Nitrite, UA: NEGATIVE
Protein,UA: NEGATIVE
RBC, UA: NEGATIVE
Specific Gravity, UA: 1.02 (ref 1.005–1.030)
Urobilinogen, Ur: 0.2 mg/dL (ref 0.2–1.0)
pH, UA: 8.5 — ABNORMAL HIGH (ref 5.0–7.5)

## 2019-08-02 MED ORDER — KETOROLAC TROMETHAMINE 10 MG PO TABS: 10.0000 mg | ORAL_TABLET | ORAL | 3 refills | Status: DC | PRN

## 2019-08-02 MED ORDER — SPIRIVA RESPIMAT 2.5 MCG/ACT IN AERS: 1.0000 | INHALATION_SPRAY | Freq: Every day | RESPIRATORY_TRACT | 6 refills | Status: DC

## 2019-08-02 MED ORDER — ALBUTEROL SULFATE HFA 108 (90 BASE) MCG/ACT IN AERS: 2.0000 | INHALATION_SPRAY | Freq: Four times a day (QID) | RESPIRATORY_TRACT | 6 refills | Status: DC | PRN

## 2019-08-02 MED ORDER — FLUTICASONE-SALMETEROL 100-50 MCG/DOSE IN AEPB: INHALATION_SPRAY | RESPIRATORY_TRACT | 6 refills | Status: DC

## 2019-08-02 MED ORDER — SUMATRIPTAN SUCCINATE 100 MG PO TABS: 100.0000 mg | ORAL_TABLET | ORAL | 12 refills | Status: DC | PRN

## 2019-08-02 NOTE — Assessment & Plan Note (Signed)
Information about counselors given today. Call with any concerns or if she feels like she needs to change anything. Continue to monitor.

## 2019-08-02 NOTE — Assessment & Plan Note (Signed)
Under good control on current regimen. Continue current regimen. Continue to monitor. Call with any concerns. Refills given. Labs drawn today.   

## 2019-08-02 NOTE — Assessment & Plan Note (Signed)
Doing much better on her spiriva. Able to run again. Feeling well. Continue current regimen. Continue to monitor. Refills given today. Call with any concerns.

## 2019-08-03 ENCOUNTER — Telehealth: Payer: Self-pay

## 2019-08-03 LAB — COMPREHENSIVE METABOLIC PANEL
ALT: 17 IU/L (ref 0–32)
AST: 12 IU/L (ref 0–40)
Albumin/Globulin Ratio: 2 (ref 1.2–2.2)
Albumin: 4.4 g/dL (ref 3.9–5.0)
Alkaline Phosphatase: 60 IU/L (ref 39–117)
BUN/Creatinine Ratio: 14 (ref 9–23)
BUN: 10 mg/dL (ref 6–20)
Bilirubin Total: 0.3 mg/dL (ref 0.0–1.2)
CO2: 25 mmol/L (ref 20–29)
Calcium: 9.4 mg/dL (ref 8.7–10.2)
Chloride: 105 mmol/L (ref 96–106)
Creatinine, Ser: 0.74 mg/dL (ref 0.57–1.00)
GFR calc Af Amer: 126 mL/min/{1.73_m2} (ref >59)
GFR calc non Af Amer: 109 mL/min/{1.73_m2} (ref >59)
Globulin, Total: 2.2 g/dL (ref 1.5–4.5)
Glucose: 54 mg/dL — ABNORMAL LOW (ref 65–99)
Potassium: 4 mmol/L (ref 3.5–5.2)
Sodium: 143 mmol/L (ref 134–144)
Total Protein: 6.6 g/dL (ref 6.0–8.5)

## 2019-08-03 LAB — CBC WITH DIFFERENTIAL/PLATELET
Basophils Absolute: 0 10*3/uL (ref 0.0–0.2)
Basos: 0 %
EOS (ABSOLUTE): 0.1 10*3/uL (ref 0.0–0.4)
Eos: 2 %
Hematocrit: 42 % (ref 34.0–46.6)
Hemoglobin: 14 g/dL (ref 11.1–15.9)
Immature Grans (Abs): 0 10*3/uL (ref 0.0–0.1)
Immature Granulocytes: 0 %
Lymphocytes Absolute: 1.7 10*3/uL (ref 0.7–3.1)
Lymphs: 33 %
MCH: 29.4 pg (ref 26.6–33.0)
MCHC: 33.3 g/dL (ref 31.5–35.7)
MCV: 88 fL (ref 79–97)
Monocytes Absolute: 0.3 10*3/uL (ref 0.1–0.9)
Monocytes: 6 %
Neutrophils Absolute: 2.9 10*3/uL (ref 1.4–7.0)
Neutrophils: 59 %
Platelets: 256 10*3/uL (ref 150–450)
RBC: 4.77 x10E6/uL (ref 3.77–5.28)
RDW: 12.1 % (ref 11.7–15.4)
WBC: 5.1 10*3/uL (ref 3.4–10.8)

## 2019-08-03 LAB — LIPID PANEL W/O CHOL/HDL RATIO
Cholesterol, Total: 194 mg/dL (ref 100–199)
HDL: 54 mg/dL (ref >39)
LDL Chol Calc (NIH): 122 mg/dL — ABNORMAL HIGH (ref 0–99)
Triglycerides: 102 mg/dL (ref 0–149)
VLDL Cholesterol Cal: 18 mg/dL (ref 5–40)

## 2019-08-03 LAB — TSH: TSH: 1.74 u[IU]/mL (ref 0.450–4.500)

## 2019-08-03 LAB — HIV ANTIBODY (ROUTINE TESTING W REFLEX): HIV Screen 4th Generation wRfx: NONREACTIVE

## 2019-08-03 NOTE — Telephone Encounter (Signed)
Prior Authorization initiated via CoverMyMeds for Spiriva Respimat 2.5MCG/ACT aerosol Key: [id number]

## 2019-08-10 ENCOUNTER — Encounter: Payer: Self-pay | Admitting: Family Medicine

## 2019-08-22 NOTE — Telephone Encounter (Signed)
Medication did not require PA because it is a covered benefit.

## 2019-08-30 ENCOUNTER — Encounter: Payer: Self-pay | Admitting: Family Medicine

## 2019-10-20 ENCOUNTER — Encounter: Payer: Self-pay | Admitting: Family Medicine

## 2019-11-17 ENCOUNTER — Encounter: Payer: Self-pay | Admitting: Family Medicine

## 2019-11-17 ENCOUNTER — Ambulatory Visit (INDEPENDENT_AMBULATORY_CARE_PROVIDER_SITE_OTHER): Payer: Self-pay | Admitting: Family Medicine

## 2019-11-17 ENCOUNTER — Other Ambulatory Visit: Payer: Self-pay

## 2019-11-17 VITALS — BP 117/81 | HR 95 | Temp 97.9°F | Ht 68.31 in | Wt 207.6 lb

## 2019-11-17 DIAGNOSIS — F419 Anxiety disorder, unspecified: Secondary | ICD-10-CM

## 2019-11-17 DIAGNOSIS — J4540 Moderate persistent asthma, uncomplicated: Secondary | ICD-10-CM

## 2019-11-17 DIAGNOSIS — R41840 Attention and concentration deficit: Secondary | ICD-10-CM

## 2019-11-17 DIAGNOSIS — R05 Cough: Secondary | ICD-10-CM

## 2019-11-17 DIAGNOSIS — R059 Cough, unspecified: Secondary | ICD-10-CM

## 2019-11-17 MED ORDER — FLUTICASONE-SALMETEROL 100-50 MCG/DOSE IN AEPB: INHALATION_SPRAY | RESPIRATORY_TRACT | 6 refills | Status: DC

## 2019-11-17 MED ORDER — PROMETHAZINE HCL 25 MG PO TABS: 25.0000 mg | ORAL_TABLET | Freq: Three times a day (TID) | ORAL | 6 refills | Status: DC | PRN

## 2019-11-17 MED ORDER — ALBUTEROL SULFATE HFA 108 (90 BASE) MCG/ACT IN AERS: 2.0000 | INHALATION_SPRAY | Freq: Four times a day (QID) | RESPIRATORY_TRACT | 6 refills | Status: DC | PRN

## 2019-11-17 MED ORDER — PREDNISONE 10 MG PO TABS: ORAL_TABLET | ORAL | 0 refills | Status: DC

## 2019-11-17 MED ORDER — SUMATRIPTAN SUCCINATE 100 MG PO TABS: 100.0000 mg | ORAL_TABLET | ORAL | 12 refills | Status: DC | PRN

## 2019-11-17 MED ORDER — SPIRIVA RESPIMAT 2.5 MCG/ACT IN AERS: 1.0000 | INHALATION_SPRAY | Freq: Every day | RESPIRATORY_TRACT | 6 refills | Status: DC

## 2019-11-17 MED ORDER — HYDROCOD POLST-CPM POLST ER 10-8 MG/5ML PO SUER: 5.0000 mL | Freq: Every evening | ORAL | 0 refills | Status: DC | PRN

## 2019-11-17 MED ORDER — SERTRALINE HCL 25 MG PO TABS
25.0000 mg | ORAL_TABLET | Freq: Every day | ORAL | 3 refills | Status: DC
Start: 2019-11-17 — End: 2019-12-26
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
## 2019-11-17 NOTE — Patient Instructions (Signed)
To schedule a COVID test, please  text "COVID" to [phone], OR you can log on to [email or web address] to easily make an on-line appointment. If you do not have access to a smart phone or PC, you can call [phone] to get assistance.  

## 2019-11-17 NOTE — Assessment & Plan Note (Signed)
In acute exacerbation. Concern for COVID- will get her tested. Will treat with prednisone. Continue inhalers. Lungs clear. Call with any concerns.

## 2019-11-17 NOTE — Assessment & Plan Note (Signed)
Has a lot going on. Will continue to work with the counselor. Will start low dose sertraline. Recheck 1 month. Call with any concerns.

## 2019-12-26 ENCOUNTER — Encounter: Payer: Self-pay | Admitting: Family Medicine

## 2019-12-26 ENCOUNTER — Other Ambulatory Visit: Payer: Self-pay

## 2019-12-26 ENCOUNTER — Ambulatory Visit (INDEPENDENT_AMBULATORY_CARE_PROVIDER_SITE_OTHER): Payer: Self-pay | Admitting: Family Medicine

## 2019-12-26 VITALS — BP 119/78 | HR 80 | Temp 98.2°F | Wt 206.0 lb

## 2019-12-26 DIAGNOSIS — F419 Anxiety disorder, unspecified: Secondary | ICD-10-CM

## 2019-12-26 MED ORDER — SERTRALINE HCL 50 MG PO TABS: 50.0000 mg | ORAL_TABLET | Freq: Every day | ORAL | 3 refills | Status: DC

## 2019-12-26 NOTE — Assessment & Plan Note (Signed)
Lots of stressors going on. Will increase her zoloft to 50 and then 100mg . Recheck 2 months. Call with any concerns.
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
## 2020-02-02 ENCOUNTER — Ambulatory Visit: Payer: Self-pay | Admitting: Family Medicine

## 2020-02-27 ENCOUNTER — Encounter: Payer: Self-pay | Admitting: Family Medicine

## 2020-02-27 ENCOUNTER — Telehealth: Payer: Self-pay | Admitting: Family Medicine

## 2020-02-27 ENCOUNTER — Other Ambulatory Visit: Payer: Self-pay

## 2020-02-27 ENCOUNTER — Ambulatory Visit (INDEPENDENT_AMBULATORY_CARE_PROVIDER_SITE_OTHER): Payer: Self-pay | Admitting: Family Medicine

## 2020-02-27 VITALS — BP 118/69 | HR 103 | Temp 98.8°F

## 2020-02-27 DIAGNOSIS — M25531 Pain in right wrist: Secondary | ICD-10-CM

## 2020-02-27 DIAGNOSIS — F419 Anxiety disorder, unspecified: Secondary | ICD-10-CM

## 2020-02-27 MED ORDER — SERTRALINE HCL 100 MG PO TABS
100.0000 mg | ORAL_TABLET | Freq: Every day | ORAL | 1 refills | Status: DC
Start: 2020-02-27 — End: 2020-05-09

## 2020-02-27 MED ORDER — KETOROLAC TROMETHAMINE 10 MG PO TABS
10.0000 mg | ORAL_TABLET | ORAL | 3 refills | Status: DC | PRN
Start: 2020-02-27 — End: 2020-08-30

## 2020-02-27 NOTE — Telephone Encounter (Signed)
1 time a day. Every 24 hours PRN

## 2020-02-27 NOTE — Telephone Encounter (Signed)
Miranda Mcdonald is calling to get advise ketorolac (TORADOL) 10 MG tablet [[id number]]  needing advise on 1 time a day every how many hours. Please advise CB- [phone]

## 2020-02-27 NOTE — Progress Notes (Signed)
BP 118/69 (BP Location: Right Arm, Patient Position: Sitting, Cuff Size: Normal)   Pulse (!) 103   Temp 98.8 F (37.1 C) (Oral)   SpO2 99%    Subjective:    Patient ID: Miranda Mcdonald, female    DOB: [birth year], 29 y.o.   MRN: [id number]  HPI: Miranda Mcdonald is a 30 y.o. female  Chief Complaint  Patient presents with  . Anxiety   ANXIETY/STRESS Duration: Chronic Status:better Anxious mood: yes  Excessive worrying: yes Irritability: yes  Sweating: no Nausea: no Palpitations:no Hyperventilation: no Panic attacks: no Agoraphobia: no  Obscessions/compulsions: no Depressed mood: yes Depression screen [facility] 2/9 02/27/2020 12/26/2019 08/02/2019 05/16/2019 07/23/2018  Decreased Interest 1 1 1  0 0  Down, Depressed, Hopeless 1 1 1 1  0  PHQ - 2 Score 2 2 2 1  0  Altered sleeping 2 2 0 1 1  Tired, decreased energy 1 1 1 1 1   Change in appetite 1 1 2 2 1   Feeling bad or failure about yourself  1 1 0 2 0  Trouble concentrating 0 1 2 3  0  Moving slowly or fidgety/restless 1 1 0 0 0  Suicidal thoughts 0 0 0 - 0  PHQ-9 Score 8 9 7 10 3   Difficult doing work/chores Somewhat difficult Somewhat difficult Somewhat difficult Somewhat difficult Not difficult at all   GAD 7 : Generalized Anxiety Score 02/27/2020 12/26/2019 08/02/2019 05/16/2019  Nervous, Anxious, on Edge 2 2 1 2   Control/stop worrying 1 1 1 1   Worry too much - different things 1 1 0 1  Trouble relaxing 2 3 1 1   Restless 2 2 0 1  Easily annoyed or irritable 1 3 1 2   Afraid - awful might happen 0 0 0 0  Total GAD 7 Score 9 12 4 8   Anxiety Difficulty Not difficult at all Somewhat difficult Somewhat difficult Somewhat difficult   Anhedonia: no Weight changes: no Insomnia: no   Hypersomnia: no Fatigue/loss of energy: yes Feelings of worthlessness: yes Feelings of guilt: yes Impaired concentration/indecisiveness: yes Suicidal ideations: no  Crying spells: no Recent Stressors/Life Changes: yes   Relationship problems: no    Family stress: yes     Financial stress: yes    Job stress: yes    Recent death/loss: no  Relevant past medical, surgical, family and social history reviewed and updated as indicated. Interim medical history since our last visit reviewed. Allergies and medications reviewed and updated.  Review of Systems  Constitutional: Negative.   Respiratory: Negative.   Cardiovascular: Negative.   Gastrointestinal: Negative.   Musculoskeletal: Negative.   Skin: Negative.   Psychiatric/Behavioral: Positive for dysphoric mood. Negative for agitation, behavioral problems, confusion, decreased concentration, hallucinations, self-injury, sleep disturbance and suicidal ideas. The patient is nervous/anxious. The patient is not hyperactive.     Per HPI unless specifically indicated above     Objective:    BP 118/69 (BP Location: Right Arm, Patient Position: Sitting, Cuff Size: Normal)   Pulse (!) 103   Temp 98.8 F (37.1 C) (Oral)   SpO2 99%   Wt Readings from Last 3 Encounters:  12/26/19 206 lb (93.4 kg)  11/17/19 207 lb 9.6 oz (94.2 kg)  08/02/19 214 lb (97.1 kg)    Physical Exam Vitals and nursing note reviewed.  Constitutional:      General: She is not in acute distress.    Appearance: Normal appearance. She is not ill-appearing, toxic-appearing or diaphoretic.  HENT:     Head: Normocephalic and  atraumatic.     Right Ear: External ear normal.     Left Ear: External ear normal.     Nose: Nose normal.     Mouth/Throat:     Mouth: Mucous membranes are moist.     Pharynx: Oropharynx is clear.  Eyes:     General: No scleral icterus.       Right eye: No discharge.        Left eye: No discharge.     Extraocular Movements: Extraocular movements intact.     Conjunctiva/sclera: Conjunctivae normal.     Pupils: Pupils are equal, round, and reactive to light.  Cardiovascular:     Rate and Rhythm: Normal rate and regular rhythm.     Pulses: Normal pulses.     Heart sounds: Normal heart  sounds. No murmur heard.  No friction rub. No gallop.   Pulmonary:     Effort: Pulmonary effort is normal. No respiratory distress.     Breath sounds: Normal breath sounds. No stridor. No wheezing, rhonchi or rales.  Chest:     Chest wall: No tenderness.  Musculoskeletal:        General: Normal range of motion.     Cervical back: Normal range of motion and neck supple.  Skin:    General: Skin is warm and dry.     Capillary Refill: Capillary refill takes less than 2 seconds.     Coloration: Skin is not jaundiced or pale.     Findings: No bruising, erythema, lesion or rash.  Neurological:     General: No focal deficit present.     Mental Status: She is alert and oriented to person, place, and time. Mental status is at baseline.  Psychiatric:        Mood and Affect: Mood normal.        Behavior: Behavior normal.        Thought Content: Thought content normal.        Judgment: Judgment normal.     Results for orders placed or performed in visit on 08/02/19  CBC with Differential/Platelet  Result Value Ref Range   WBC 5.1 3.4 - 10.8 x10E3/uL   RBC 4.77 3.77 - 5.28 x10E6/uL   Hemoglobin 14.0 11.1 - 15.9 g/dL   Hematocrit 45.3 64.6 - 46.6 %   MCV 88 79 - 97 fL   MCH 29.4 26.6 - 33.0 pg   MCHC 33.3 31 - 35 g/dL   RDW 80.3 21.2 - 24.8 %   Platelets 256 150 - 450 x10E3/uL   Neutrophils 59 Not Estab. %   Lymphs 33 Not Estab. %   Monocytes 6 Not Estab. %   Eos 2 Not Estab. %   Basos 0 Not Estab. %   Neutrophils Absolute 2.9 1 - 7 x10E3/uL   Lymphocytes Absolute 1.7 0 - 3 x10E3/uL   Monocytes Absolute 0.3 0 - 0 x10E3/uL   EOS (ABSOLUTE) 0.1 0.0 - 0.4 x10E3/uL   Basophils Absolute 0.0 0 - 0 x10E3/uL   Immature Granulocytes 0 Not Estab. %   Immature Grans (Abs) 0.0 0.0 - 0.1 x10E3/uL  Comprehensive metabolic panel  Result Value Ref Range   Glucose 54 (L) 65 - 99 mg/dL   BUN 10 6 - 20 mg/dL   Creatinine, Ser 2.50 0.57 - 1.00 mg/dL   GFR calc non Af Amer 109 >59 mL/min/1.73    GFR calc Af Amer 126 >59 mL/min/1.73   BUN/Creatinine Ratio 14 9 - 23   Sodium 143  134 - 144 mmol/L   Potassium 4.0 3.5 - 5.2 mmol/L   Chloride 105 96 - 106 mmol/L   CO2 25 20 - 29 mmol/L   Calcium 9.4 8.7 - 10.2 mg/dL   Total Protein 6.6 6.0 - 8.5 g/dL   Albumin 4.4 3.9 - 5.0 g/dL   Globulin, Total 2.2 1.5 - 4.5 g/dL   Albumin/Globulin Ratio 2.0 1.2 - 2.2   Bilirubin Total 0.3 0.0 - 1.2 mg/dL   Alkaline Phosphatase 60 39 - 117 IU/L   AST 12 0 - 40 IU/L   ALT 17 0 - 32 IU/L  HIV Antibody (routine testing w rflx)  Result Value Ref Range   HIV Screen 4th Generation wRfx Non Reactive Non Reactive  Lipid Panel w/o Chol/HDL Ratio  Result Value Ref Range   Cholesterol, Total 194 100 - 199 mg/dL   Triglycerides 287 0 - 149 mg/dL   HDL 54 >86 mg/dL   VLDL Cholesterol Cal 18 5 - 40 mg/dL   LDL Chol Calc (NIH) 767 (H) 0 - 99 mg/dL  TSH  Result Value Ref Range   TSH 1.740 0.450 - 4.500 uIU/mL  UA/M w/rflx Culture, Routine   Specimen: Urine   URINE  Result Value Ref Range   Specific Gravity, UA 1.020 1.005 - 1.030   pH, UA 8.5 (H) 5.0 - 7.5   Color, UA Yellow Yellow   Appearance Ur Clear Clear   Leukocytes,UA Negative Negative   Protein,UA Negative Negative/Trace   Glucose, UA Negative Negative   Ketones, UA Negative Negative   RBC, UA Negative Negative   Bilirubin, UA Negative Negative   Urobilinogen, Ur 0.2 0.2 - 1.0 mg/dL   Nitrite, UA Negative Negative      Assessment & Plan:   Problem List Items Addressed This Visit      Other   Acute anxiety - Primary    Doing better. Will continue on the 100mg , does not want to go up at this time. Continue to monitor. Call with any concerns. Refills given today.      Relevant Medications   sertraline (ZOLOFT) 100 MG tablet    Other Visit Diagnoses    Right wrist pain       Working with ortho, needs CT and MRI- will draw labs for them today.   Relevant Orders   Creatinine       Follow up plan: Return in about 6 months  (around 08/29/2020).

## 2020-02-27 NOTE — Assessment & Plan Note (Signed)
Doing better. Will continue on the 100mg , does not want to go up at this time. Continue to monitor. Call with any concerns. Refills given today.

## 2020-02-27 NOTE — Telephone Encounter (Signed)
Pharmacy notified.

## 2020-02-28 LAB — CREATININE, SERUM
Creatinine, Ser: 0.68 mg/dL (ref 0.57–1.00)
GFR calc Af Amer: 136 mL/min/{1.73_m2} (ref >59)
GFR calc non Af Amer: 118 mL/min/{1.73_m2} (ref >59)

## 2020-03-22 IMAGING — CR LEFT WRIST - COMPLETE 3+ VIEW
4 series · 4 of 4 positions shown · non-contrast
Comparison: None.

CLINICAL DATA: Pain

EXAM:
LEFT WRIST - COMPLETE 3+ VIEW

[wrist pa]
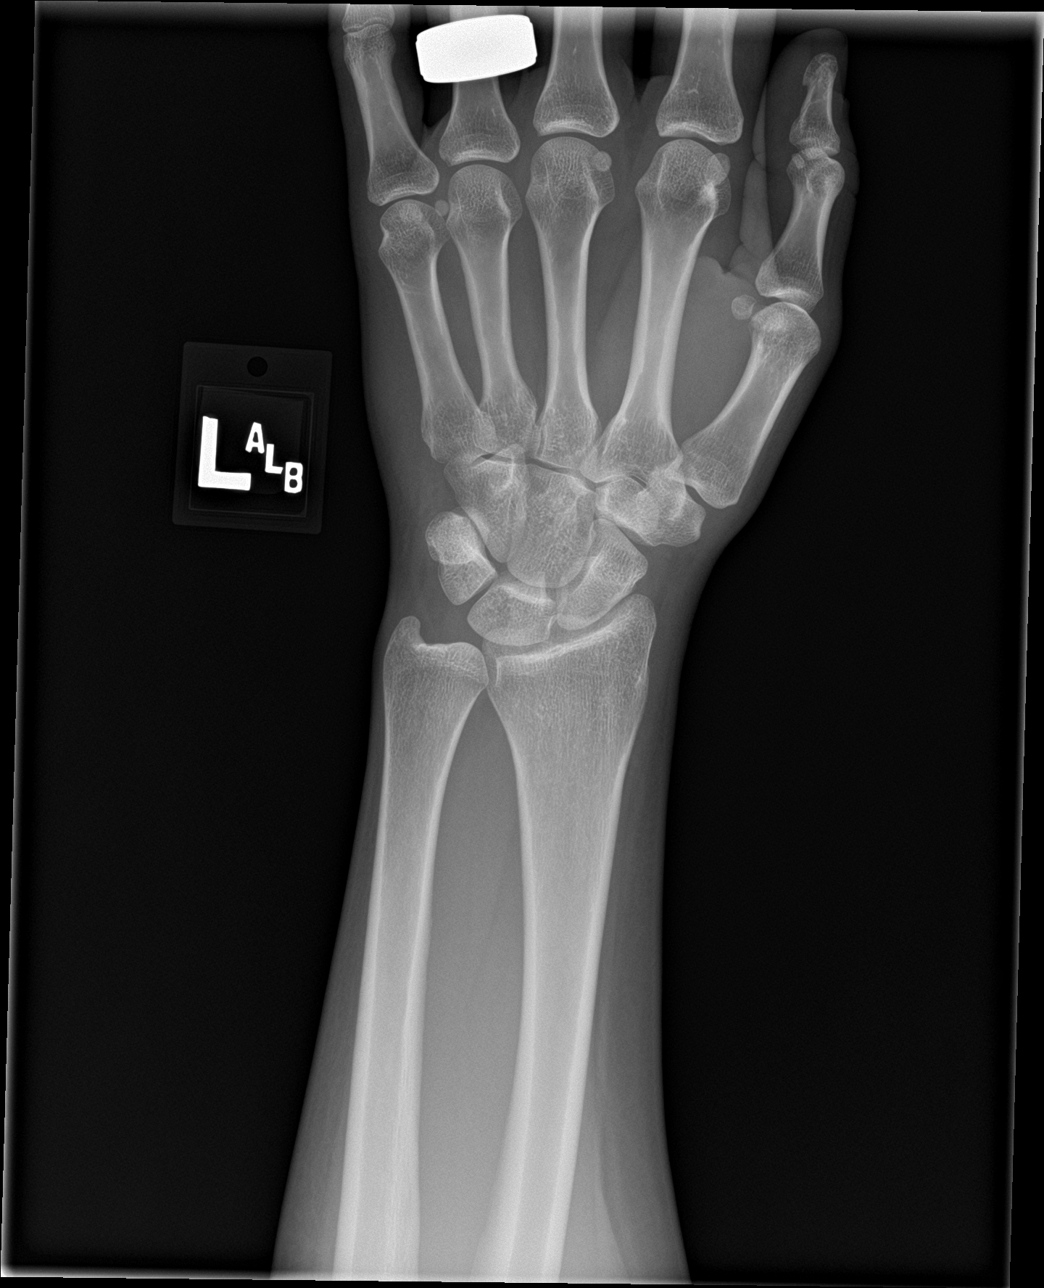

[wrist obl]
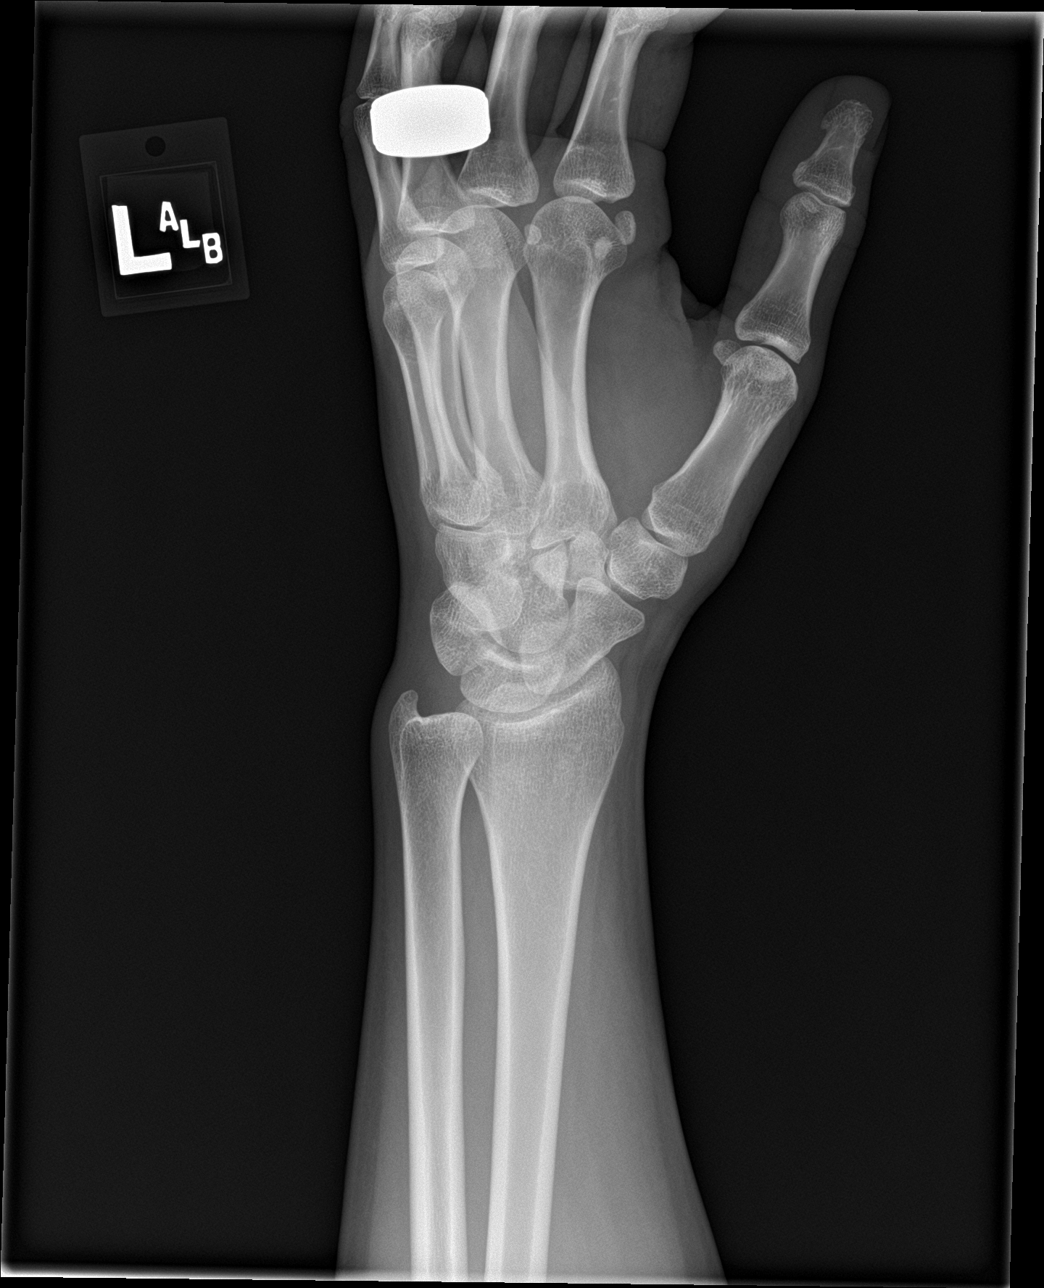

[wrist lat]
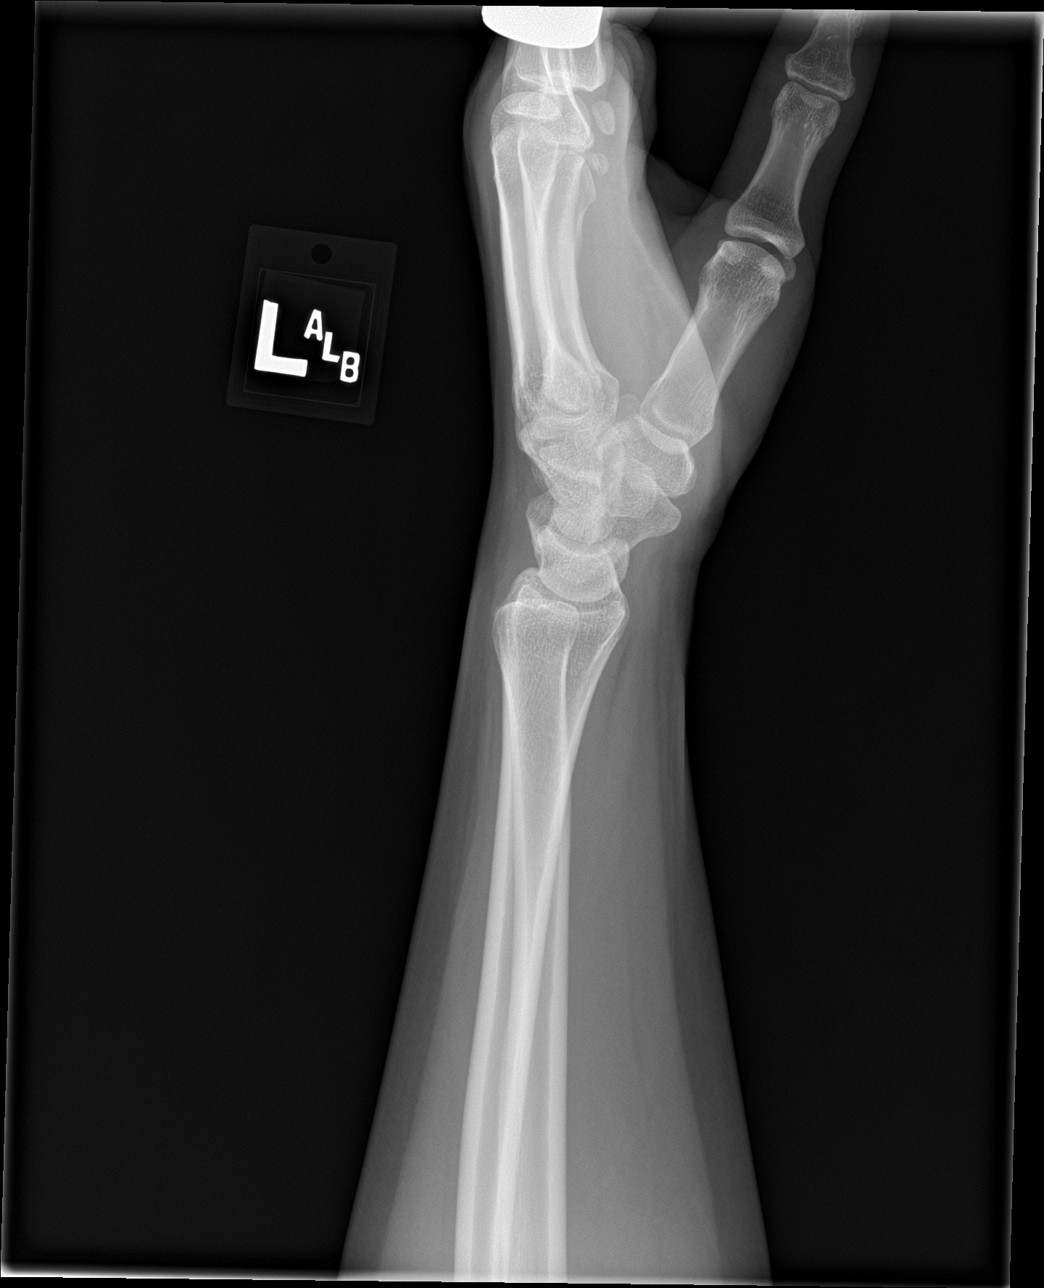

[navicular]
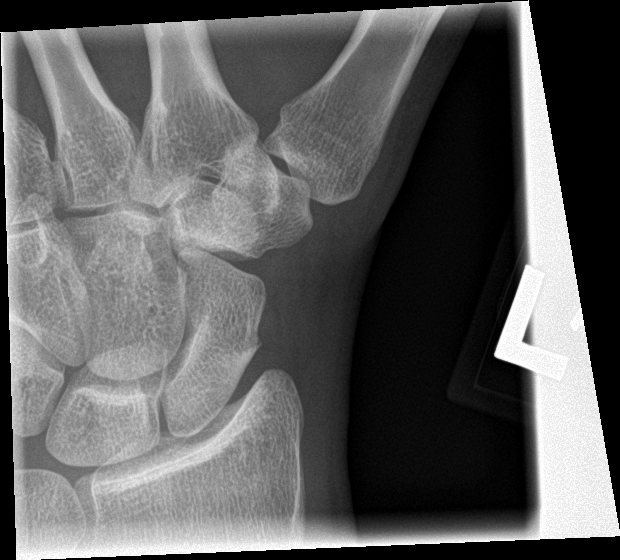

[4 of 4 positions shown; findings below may reference images not displayed]

FINDINGS: Findings are concerning for a nondisplaced fracture through the
waist of the scaphoid. There is some mild soft tissue swelling about
the wrist. There is no dislocation.
IMPRESSION: Overall findings suspicious for a nondisplaced fracture through the
waist of the scaphoid. As this is only visualized on a single view,
repeat radiographs are recommended in 10-14 days for confirmation of
this finding.

## 2020-05-09 ENCOUNTER — Other Ambulatory Visit: Payer: Self-pay

## 2020-05-09 ENCOUNTER — Ambulatory Visit (INDEPENDENT_AMBULATORY_CARE_PROVIDER_SITE_OTHER): Payer: Self-pay | Admitting: Family Medicine

## 2020-05-09 ENCOUNTER — Encounter: Payer: Self-pay | Admitting: Family Medicine

## 2020-05-09 DIAGNOSIS — J4540 Moderate persistent asthma, uncomplicated: Secondary | ICD-10-CM

## 2020-05-09 DIAGNOSIS — F419 Anxiety disorder, unspecified: Secondary | ICD-10-CM

## 2020-05-09 MED ORDER — SUMATRIPTAN SUCCINATE 6 MG/0.5ML ~~LOC~~ SOAJ
6.0000 mg | Freq: Every day | SUBCUTANEOUS | 12 refills | Status: DC | PRN
Start: 2020-05-09 — End: 2022-12-26

## 2020-05-09 MED ORDER — ALBUTEROL SULFATE HFA 108 (90 BASE) MCG/ACT IN AERS: 2.0000 | INHALATION_SPRAY | Freq: Four times a day (QID) | RESPIRATORY_TRACT | 6 refills | Status: DC | PRN

## 2020-05-09 MED ORDER — BUSPIRONE HCL 5 MG PO TABS
5.0000 mg | ORAL_TABLET | Freq: Three times a day (TID) | ORAL | 1 refills | Status: DC | PRN
Start: 2020-05-09 — End: 2020-08-13

## 2020-05-09 MED ORDER — SERTRALINE HCL 100 MG PO TABS
100.0000 mg | ORAL_TABLET | Freq: Every day | ORAL | 1 refills | Status: DC
Start: 2020-05-09 — End: 2021-09-04

## 2020-05-09 MED ORDER — FLUTICASONE-SALMETEROL 100-50 MCG/DOSE IN AEPB
INHALATION_SPRAY | RESPIRATORY_TRACT | 6 refills | Status: DC
Start: 2020-05-09 — End: 2021-03-04

## 2020-05-09 MED ORDER — PROMETHAZINE HCL 25 MG PO TABS: 25.0000 mg | ORAL_TABLET | Freq: Three times a day (TID) | ORAL | 6 refills | Status: DC | PRN

## 2020-05-09 MED ORDER — SPIRIVA RESPIMAT 2.5 MCG/ACT IN AERS
1.0000 | INHALATION_SPRAY | Freq: Every day | RESPIRATORY_TRACT | 6 refills | Status: DC
Start: 2020-05-09 — End: 2020-09-14

## 2020-05-09 NOTE — Assessment & Plan Note (Signed)
Refills given today. Stable.

## 2020-05-09 NOTE — Assessment & Plan Note (Signed)
Doing worse. Will start her on buspar and continue sertraline. Continue to monitor. Call with any concerns or if not helping.

## 2020-05-09 NOTE — Progress Notes (Signed)
BP 118/77   Pulse 76   Temp 98.1 F (36.7 C) (Oral)   Wt 223 lb 6.4 oz (101.3 kg)   SpO2 99%   BMI 33.66 kg/m    Subjective:    Patient ID: Miranda Mcdonald, female    DOB: 07-15-1990, 29 y.o.   MRN: [id number]  HPI: Miranda Mcdonald is a 29 y.o. female  Chief Complaint  Patient presents with  . Anxiety   ANXIETY- things have gotten way worse. She has several family members with illnesses. She is still out of work due to her her arm injury. Daughter has finally got in for evaluation for sensory issues and now has a lot of appointments. Has a lot going on.  Duration: months- worsening Status:exacerbated Anxious mood: yes  Excessive worrying: yes Irritability: no  Sweating: no Nausea: no Palpitations:no Hyperventilation: no Panic attacks: no Agoraphobia: no  Obscessions/compulsions: no Depressed mood: yes Depression screen [facility] 2/9 05/09/2020 02/27/2020 12/26/2019 08/02/2019 05/16/2019  Decreased Interest 2 1 1 1  0  Down, Depressed, Hopeless 2 1 1 1 1   PHQ - 2 Score 4 2 2 2 1   Altered sleeping 3 2 2  0 1  Tired, decreased energy 1 1 1 1 1   Change in appetite 3 1 1 2 2   Feeling bad or failure about yourself  2 1 1  0 2  Trouble concentrating 1 0 1 2 3   Moving slowly or fidgety/restless 1 1 1  0 0  Suicidal thoughts 1 0 0 0 -  PHQ-9 Score 16 8 9 7 10   Difficult doing work/chores Very difficult Somewhat difficult Somewhat difficult Somewhat difficult Somewhat difficult   GAD 7 : Generalized Anxiety Score 05/09/2020 02/27/2020 12/26/2019 08/02/2019  Nervous, Anxious, on Edge 3 2 2 1   Control/stop worrying 3 1 1 1   Worry too much - different things 2 1 1  0  Trouble relaxing 3 2 3 1   Restless 3 2 2  0  Easily annoyed or irritable 3 1 3 1   Afraid - awful might happen 1 0 0 0  Total GAD 7 Score 18 9 12 4   Anxiety Difficulty Extremely difficult Not difficult at all Somewhat difficult Somewhat difficult   Anhedonia: no Weight changes: no Insomnia: yes   Hypersomnia: no Fatigue/loss of  energy: yes Feelings of worthlessness: yes Feelings of guilt: no Impaired concentration/indecisiveness: no Suicidal ideations: no  Crying spells: no Recent Stressors/Life Changes: yes   Relationship problems: no   Family stress: yes     Financial stress: yes    Job stress: yes    Recent death/loss: no   Relevant past medical, surgical, family and social history reviewed and updated as indicated. Interim medical history since our last visit reviewed. Allergies and medications reviewed and updated.  Review of Systems  Constitutional: Negative.   Respiratory: Negative.   Cardiovascular: Negative.   Gastrointestinal: Negative.   Musculoskeletal: Negative.   Neurological: Negative.   Psychiatric/Behavioral: Positive for dysphoric mood. Negative for agitation, behavioral problems, confusion, decreased concentration, hallucinations, self-injury, sleep disturbance and suicidal ideas. The patient is nervous/anxious. The patient is not hyperactive.     Per HPI unless specifically indicated above     Objective:    BP 118/77   Pulse 76   Temp 98.1 F (36.7 C) (Oral)   Wt 223 lb 6.4 oz (101.3 kg)   SpO2 99%   BMI 33.66 kg/m   Wt Readings from Last 3 Encounters:  05/09/20 223 lb 6.4 oz (101.3 kg)  12/26/19 206 lb (93.4  kg)  11/17/19 207 lb 9.6 oz (94.2 kg)    Physical Exam Vitals and nursing note reviewed.  Constitutional:      General: She is not in acute distress.    Appearance: Normal appearance. She is not ill-appearing, toxic-appearing or diaphoretic.  HENT:     Head: Normocephalic and atraumatic.     Right Ear: External ear normal.     Left Ear: External ear normal.     Nose: Nose normal.     Mouth/Throat:     Mouth: Mucous membranes are moist.     Pharynx: Oropharynx is clear.  Eyes:     General: No scleral icterus.       Right eye: No discharge.        Left eye: No discharge.     Extraocular Movements: Extraocular movements intact.     Conjunctiva/sclera:  Conjunctivae normal.     Pupils: Pupils are equal, round, and reactive to light.  Cardiovascular:     Rate and Rhythm: Normal rate and regular rhythm.     Pulses: Normal pulses.     Heart sounds: Normal heart sounds. No murmur heard.  No friction rub. No gallop.   Pulmonary:     Effort: Pulmonary effort is normal. No respiratory distress.     Breath sounds: Normal breath sounds. No stridor. No wheezing, rhonchi or rales.  Chest:     Chest wall: No tenderness.  Musculoskeletal:        General: Normal range of motion.     Cervical back: Normal range of motion and neck supple.  Skin:    General: Skin is warm and dry.     Capillary Refill: Capillary refill takes less than 2 seconds.     Coloration: Skin is not jaundiced or pale.     Findings: No bruising, erythema, lesion or rash.  Neurological:     General: No focal deficit present.     Mental Status: She is alert and oriented to person, place, and time. Mental status is at baseline.  Psychiatric:        Mood and Affect: Mood normal.        Behavior: Behavior normal.        Thought Content: Thought content normal.        Judgment: Judgment normal.     Results for orders placed or performed in visit on 02/27/20  Creatinine  Result Value Ref Range   Creatinine, Ser 0.68 0.57 - 1.00 mg/dL   GFR calc non Af Amer 118 >59 mL/min/1.73   GFR calc Af Amer 136 >59 mL/min/1.73      Assessment & Plan:   Problem List Items Addressed This Visit      Respiratory   Asthma, moderate persistent    Refills given today. Stable.       Relevant Medications   Fluticasone-Salmeterol (ADVAIR DISKUS) 100-50 MCG/DOSE AEPB   Tiotropium Bromide Monohydrate (SPIRIVA RESPIMAT) 2.5 MCG/ACT AERS   albuterol (VENTOLIN HFA) 108 (90 Base) MCG/ACT inhaler   Other Relevant Orders   Referral to Chronic Care Management Services     Other   Acute anxiety    Doing worse. Will start her on buspar and continue sertraline. Continue to monitor. Call with any  concerns or if not helping.       Relevant Medications   sertraline (ZOLOFT) 100 MG tablet   busPIRone (BUSPAR) 5 MG tablet       Follow up plan: Return in about 6 months (around 11/06/2020).

## 2020-05-10 ENCOUNTER — Telehealth: Payer: Self-pay

## 2020-05-10 NOTE — Chronic Care Management (AMB) (Signed)
°  Care Management   Note  05/10/2020 Name: Miranda Mcdonald MRN: [id number] DOB: 09/11/1990  Miranda Mcdonald is a 29 y.o. year old female who is a primary care patient of Dorcas Carrow, DO. I reached out to [name] by phone today in response to a referral sent by Miranda Mcdonald's health plan.    Miranda Mcdonald was given information about care management services today including:  1. Care management services include personalized support from designated clinical staff supervised by her physician, including individualized plan of care and coordination with other care providers 2. 24/7 contact phone numbers for assistance for urgent and routine care needs. 3. The patient may stop care management services at any time by phone call to the office staff.  Patient agreed to services and verbal consent obtained.   Follow up plan: Telephone appointment with care management team member scheduled for:05/30/2020  Penne Lash, RMA Care Guide, Embedded Care Coordination [facility]  [place], [place] [address] Direct Dial: [phone] [email or web address]

## 2020-05-29 ENCOUNTER — Telehealth: Payer: Self-pay | Admitting: Pharmacist

## 2020-05-29 NOTE — Chronic Care Management (AMB) (Signed)
Chronic Care Management Pharmacy Assistant   Name: Nadeen Shipman  MRN: [id number] DOB: 03-Mar-1991  Reason for Encounter: General Adherence Assessment  Patient Questions:  1.  Have you seen any other providers since your last visit? No  2.  Any changes in your medicines or health? No    PCP : Olevia Perches P, DO  Allergies:   Allergies  Allergen Reactions   Meloxicam Shortness Of Breath and Other (See Comments)    Difficulty breathing    Morphine And Related Other (See Comments)    Mean, angry, violent    Percocet [Oxycodone-Acetaminophen] Other (See Comments)    Migraine    Medications: Outpatient Encounter Medications as of 05/29/2020  Medication Sig   albuterol (VENTOLIN HFA) 108 (90 Base) MCG/ACT inhaler Inhale 2 puffs into the lungs every 6 (six) hours as needed for wheezing or shortness of breath.   busPIRone (BUSPAR) 5 MG tablet Take 1-3 tablets (5-15 mg total) by mouth 3 (three) times daily as needed.   diclofenac sodium (VOLTAREN) 1 % GEL Apply 4 g topically 4 (four) times daily.   Fluticasone-Salmeterol (ADVAIR DISKUS) 100-50 MCG/DOSE AEPB INHALE 1 PUFF INTO THE LUNGS 2 TIMES DAILY.   ketorolac (TORADOL) 10 MG tablet Take 1 tablet (10 mg total) by mouth as needed.   promethazine (PHENERGAN) 25 MG tablet Take 1 tablet (25 mg total) by mouth every 8 (eight) hours as needed for nausea or vomiting.   sertraline (ZOLOFT) 100 MG tablet Take 1 tablet (100 mg total) by mouth daily.   SUMAtriptan (IMITREX) 100 MG tablet Take 1 tablet (100 mg total) by mouth every 2 (two) hours as needed for migraine. May repeat in 2 hours if headache persists or recurs.   SUMAtriptan 6 MG/0.5ML SOAJ Inject 6 mg into the skin daily as needed. For migraine   Tiotropium Bromide Monohydrate (SPIRIVA RESPIMAT) 2.5 MCG/ACT AERS Inhale 1 puff into the lungs daily.   No facility-administered encounter medications on file as of 05/29/2020.    Current Diagnosis: Patient Active  Problem List   Diagnosis Date Noted   Acute anxiety 08/02/2019   History of abnormal Pap smear 01/18/2018   Migraine 02/22/2016   Asthma, moderate persistent 01/14/2016   Have you seen any other providers since your last visit? No  Any changes in your medications or health? No  Any side effects from any medications? No  Do you have an symptoms or problems not managed by your medications? No  Any concerns about your health right now? Shattered wrist back in May, unable to do much with her wrist. Lost insurance because she is unable to work.   Has your provider asked that you check blood pressure, blood sugar, or follow special diet at home? No  Do you get any type of exercise on a regular basis? NO  Can you think of a goal you would like to reach for your health ? Be able to afford medications.  Do you have any problems getting your medications? No  Is there anything that you would like to discuss during the appointment? Advair and Spiriva assistance.  Please bring medications and supplements to appointment  Goals Addressed   None    05/29/20-CPA called and spoke with the patient in regards of general adherence with medications and assistance needed. The patient voiced she has no issues with her health currently however she will like to receive assistance for her Advair and Spiriva inhalers. The patient communicated with CPP she is paying between $  700-$800 dollars every 2 months for Advair and Spiriva. The patient voiced she is taking taekwondo classes and running 2 to 3 times a week for exercise. 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 The patient states her husband is the only household member who is working at the present time while she cares for her daughter at home. CPA communicated with the patient will coordinate Patient Assistance to see if she qualifies for assistance with her Advair and  Spiriva inhalers. The patient verbalized understanding.    Suezanne Cheshire, CMA Clinical Pharmicist Assistant [phone]   Follow-Up:  CPA will initiate PAP for Spiriva and Advair on 05/29/20.

## 2020-05-30 ENCOUNTER — Ambulatory Visit: Payer: Medicaid Other | Admitting: Pharmacist

## 2020-05-30 NOTE — Progress Notes (Signed)
CARE PLAN ENTRY  Chronic Care Management   Note  05/30/2020 Name: Miranda Mcdonald MRN: [id number] DOB: 1991/04/04  Reason for referral: Financial assistance  Referral source: Dr. Wynetta Emery  Referral medication(s): Advair, Spiriva  Current insurance:no prescription coverage  PMHx: Moderate peristent asthma   HPI: Fractured wrist in May 2021, unable to work as Marine scientist. No prescription insurance.   Objective: Allergies  Allergen Reactions  . Meloxicam Shortness Of Breath and Other (See Comments)    Difficulty breathing   . Morphine And Related Other (See Comments)    Mean, angry, violent   . Percocet [Oxycodone-Acetaminophen] Other (See Comments)    Migraine    Medications Reviewed Today    Reviewed by Valerie Roys, DO (Physician) on 05/09/20 at [facility] List Status: <None>  Medication Order Taking? Sig Documenting Provider Last Dose Status Informant  albuterol (VENTOLIN HFA) 108 (90 Base) MCG/ACT inhaler [id number] Yes Inhale 2 puffs into the lungs every 6 (six) hours as needed for wheezing or shortness of breath. Johnson, Megan P, DO  Active   busPIRone (BUSPAR) 5 MG tablet [id number]  Take 1-3 tablets (5-15 mg total) by mouth 3 (three) times daily as needed. Johnson, Megan P, DO  Active   diclofenac sodium (VOLTAREN) 1 % GEL [id number] Yes Apply 4 g topically 4 (four) times daily. Johnson, Megan P, DO Taking Active   Fluticasone-Salmeterol (ADVAIR DISKUS) 100-50 MCG/DOSE AEPB [id number] Yes INHALE 1 PUFF INTO THE LUNGS 2 TIMES DAILY. Johnson, Megan P, DO  Active   ketorolac (TORADOL) 10 MG tablet [id number] Yes Take 1 tablet (10 mg total) by mouth as needed. Johnson, Megan P, DO Taking Active   promethazine (PHENERGAN) 25 MG tablet [id number] Yes Take 1 tablet (25 mg total) by mouth every 8 (eight) hours as needed for nausea or vomiting. Johnson, Megan P, DO  Active   sertraline (ZOLOFT) 100 MG tablet [id number] Yes Take 1 tablet (100 mg total) by mouth daily. Johnson, Megan P, DO   Active   SUMAtriptan (IMITREX) 100 MG tablet [id number] Yes Take 1 tablet (100 mg total) by mouth every 2 (two) hours as needed for migraine. May repeat in 2 hours if headache persists or recurs. Park Liter P, DO Taking Active   SUMAtriptan 6 MG/0.5ML Darden Palmer [id number] Yes Inject 6 mg into the skin daily as needed. For migraine Wynetta Emery, Megan P, DO  Active   Tiotropium Bromide Monohydrate (SPIRIVA RESPIMAT) 2.5 MCG/ACT AERS [id number] Yes Inhale 1 puff into the lungs daily. Park Liter P, DO  Active            Medication Assistance Findings:  Extra Help:   '[]'  Already receiving Full Extra Help  '[]'  Already receiving Partial Extra Help  '[]'  Eligible based on reported income and assets  '[x]'  Not Eligible based on reported income and assets  Patient Assistance Programs: 1) Advair made by [place] requirement met: '[x]'  Yes '[]'  No '[]'  Unknown ( borderline depending on patient earning prior to injury) o Out-of-pocket prescription expenditure met:    '[]'  Yes '[]'  No  '[]'  Unknown  '[x]'  Not applicable        2)  Spiriva made by [employer] o Income requirement met: '[]'  Yes '[]'  No  '[x]'  Unknown (not published) o Out-of-pocket prescription expenditure met:   '[]'  Yes '[]'  No   '[]'  Unknown '[x]'  Not applicable o   Additional medication assistance options reviewed with patient as warranted:  [employer], [place] request, Visual merchandiser  Goals Addressed            This Visit's Progress   . Financial barriers Pharmacy       Current Barriers:  . Financial Barriers in complicated patient with multiple medical conditions including moderate persistent asthma. Patient has no insurance and reports copay for  Advair and Spiriva are cost prohibitive at this time  Pharmacist Clinical Goal(s):  Marland Kitchen Over the next 30 days, patient will work with PharmD and providers to relieve medication access concerns  Interventions: . Comprehensive medication review completed; medication list updated in  electronic medical record.  .  Patient household income estimate is borderline  criteria for this medication's patient assistance program. Reviewed application process. Patient will provide proof of income, out of pocket spend report, and will sign application. Will collaborate with primary care provider Dr. Wynetta Emery for their portion of application. Once completed, will submit to  [place] and [employer] patient assistance program . Inter-disciplinary care team collaboration (see longitudinal plan of care) . CPA will complete forms and send to patient for her portion.  Patient Self Care Activities:  . Patient will provide necessary portions of application   Initial goal documentation         Plan: Patient is to provide additional documents as necessary. Will seek PAP for Advair and Spiriva. Can pursue alternate therapies if necessary.  Follow up:  CPA to follow up when applications ready for patient portion.   Junita Push. Kenton Kingfisher PharmD, [facility] [phone]

## 2020-07-18 ENCOUNTER — Telehealth: Payer: Medicaid Other | Admitting: Family Medicine

## 2020-07-18 ENCOUNTER — Encounter: Payer: Self-pay | Admitting: Family Medicine

## 2020-07-18 ENCOUNTER — Other Ambulatory Visit: Payer: Self-pay

## 2020-07-18 ENCOUNTER — Telehealth (INDEPENDENT_AMBULATORY_CARE_PROVIDER_SITE_OTHER): Payer: 59 | Admitting: Family Medicine

## 2020-07-18 DIAGNOSIS — Z20822 Contact with and (suspected) exposure to covid-19: Secondary | ICD-10-CM | POA: Diagnosis not present

## 2020-07-18 MED ORDER — PREDNISONE 10 MG PO TABS: ORAL_TABLET | ORAL | 0 refills | Status: DC

## 2020-07-18 MED ORDER — HYDROCOD POLST-CPM POLST ER 10-8 MG/5ML PO SUER: 5.0000 mL | Freq: Two times a day (BID) | ORAL | 0 refills | Status: DC | PRN

## 2020-07-18 NOTE — Progress Notes (Signed)
There were no vitals taken for this visit.   Subjective:    Patient ID: Miranda Mcdonald, female    DOB: 1991-05-25, 30 y.o.   MRN: [id number]  HPI: Miranda Mcdonald is a 30 y.o. female  Chief Complaint  Patient presents with  . Covid Exposure   UPPER RESPIRATORY TRACT INFECTION- exposed on Sunday Worst symptom: fatigue, congestion, sore throat  Fever: no Cough: yes Shortness of breath: yes Wheezing: yes Chest pain: no Chest tightness: yes Chest congestion: no Nasal congestion: yes Runny nose: yes Post nasal drip: yes Sneezing: no Sore throat: yes Swollen glands: no Sinus pressure: yes Headache: yes Face pain: no Toothache: yes Ear pain: yes  Ear pressure: yes  Eyes red/itching:yes Eye drainage/crusting: yes  Vomiting: no Rash: no Fatigue: yes Sick contacts: yes Strep contacts: no  Context: worse Recurrent sinusitis: no Relief with OTC cold/cough medications: no  Treatments attempted: cold/sinus    Relevant past medical, surgical, family and social history reviewed and updated as indicated. Interim medical history since our last visit reviewed. Allergies and medications reviewed and updated.  Review of Systems  Constitutional: Positive for fatigue. Negative for activity change, appetite change, chills, diaphoresis, fever and unexpected weight change.  HENT: Positive for congestion, postnasal drip, rhinorrhea and sinus pressure. Negative for dental problem, drooling, ear discharge, ear pain, facial swelling, hearing loss, mouth sores, nosebleeds, sinus pain, sneezing, sore throat, tinnitus, trouble swallowing and voice change.   Respiratory: Positive for cough, chest tightness, shortness of breath and wheezing. Negative for apnea, choking and stridor.   Cardiovascular: Negative.   Gastrointestinal: Negative.   Musculoskeletal: Negative.   Psychiatric/Behavioral: Negative.     Per HPI unless specifically indicated above     Objective:    There were no vitals  taken for this visit.  Wt Readings from Last 3 Encounters:  05/09/20 223 lb 6.4 oz (101.3 kg)  12/26/19 206 lb (93.4 kg)  11/17/19 207 lb 9.6 oz (94.2 kg)    Physical Exam Vitals and nursing note reviewed.  Constitutional:      General: She is not in acute distress.    Appearance: Normal appearance. She is not ill-appearing, toxic-appearing or diaphoretic.  HENT:     Head: Normocephalic and atraumatic.     Right Ear: External ear normal.     Left Ear: External ear normal.     Nose: Nose normal.     Mouth/Throat:     Mouth: Mucous membranes are moist.     Pharynx: Oropharynx is clear.  Eyes:     General: No scleral icterus.       Right eye: No discharge.        Left eye: No discharge.     Conjunctiva/sclera: Conjunctivae normal.     Pupils: Pupils are equal, round, and reactive to light.  Pulmonary:     Effort: Pulmonary effort is normal. No respiratory distress.     Comments: Speaking in full sentences Musculoskeletal:        General: Normal range of motion.     Cervical back: Normal range of motion.  Skin:    Coloration: Skin is not jaundiced or pale.     Findings: No bruising, erythema, lesion or rash.  Neurological:     Mental Status: She is alert and oriented to person, place, and time. Mental status is at baseline.  Psychiatric:        Mood and Affect: Mood normal.        Behavior: Behavior normal.  Thought Content: Thought content normal.        Judgment: Judgment normal.     Results for orders placed or performed in visit on 02/27/20  Creatinine  Result Value Ref Range   Creatinine, Ser 0.68 0.57 - 1.00 mg/dL   GFR calc non Af Amer 118 >59 mL/min/1.73   GFR calc Af Amer 136 >59 mL/min/1.73      Assessment & Plan:   Problem List Items Addressed This Visit   None   Visit Diagnoses    Suspected COVID-19 virus infection    -  Primary   Will get swabbed. Self-quarantine until results are back. Treat symptoms with prednisone and tussionex. Call with  any concerns. Continue to monitor.    Relevant Orders   Novel Coronavirus, NAA ([facility])       Follow up plan: Return if symptoms worsen or fail to improve.   . This visit was completed via MyChart due to the restrictions of the COVID-19 pandemic. All issues as above were discussed and addressed. Physical exam was done as above through visual confirmation on MyChart. If it was felt that the patient should be evaluated in the office, they were directed there. The patient verbally consented to this visit. . Location of the patient: home . Location of the provider: home . Those involved with this call:  . Provider: Olevia Perches, DO . CMA: Wilhemena Durie, CMA . Front Desk/Registration: Harriet Pho  . Time spent on call: 15 minutes with patient face to face via video conference. More than 50% of this time was spent in counseling and coordination of care. 23 minutes total spent in review of patient's record and preparation of their chart.

## 2020-07-19 NOTE — Addendum Note (Signed)
Addended by: Enedina Finner on: 07/19/2020 11:25 AM   Modules accepted: Orders

## 2020-07-22 ENCOUNTER — Encounter: Payer: Self-pay | Admitting: Family Medicine

## 2020-07-22 LAB — NOVEL CORONAVIRUS, NAA: SARS-CoV-2, NAA: NOT DETECTED

## 2020-07-22 LAB — SARS-COV-2, NAA 2 DAY TAT

## 2020-08-01 ENCOUNTER — Other Ambulatory Visit: Payer: Self-pay

## 2020-08-01 ENCOUNTER — Ambulatory Visit (INDEPENDENT_AMBULATORY_CARE_PROVIDER_SITE_OTHER): Payer: 59

## 2020-08-01 ENCOUNTER — Encounter: Payer: Self-pay | Admitting: Emergency Medicine

## 2020-08-01 ENCOUNTER — Ambulatory Visit
Admission: EM | Admit: 2020-08-01 | Discharge: 2020-08-01 | Disposition: A | Discharge status: Home / Self Care | Payer: 59 | Attending: Sports Medicine | Admitting: Sports Medicine

## 2020-08-01 DIAGNOSIS — M654 Radial styloid tenosynovitis [de Quervain]: Secondary | ICD-10-CM

## 2020-08-01 DIAGNOSIS — Z8781 Personal history of (healed) traumatic fracture: Secondary | ICD-10-CM | POA: Diagnosis not present

## 2020-08-01 DIAGNOSIS — M25531 Pain in right wrist: Secondary | ICD-10-CM

## 2020-08-01 MED ORDER — DICLOFENAC SODIUM 75 MG PO TBEC: 75.0000 mg | DELAYED_RELEASE_TABLET | Freq: Two times a day (BID) | ORAL | 0 refills | Status: AC

## 2020-08-01 NOTE — ED Triage Notes (Addendum)
Patient in today c/o right wrist pain and "grinding" x 1 week. Patient states she broke that wrist in May 2021. No new injury noted. Patient has been taking Tylenol, Advil and Aleve without relief. Patient states she had an MRI in Oct 2021 and there was still a "cracked bone". Patient did not have any surgeries on her right wrist.

## 2020-08-01 NOTE — Discharge Instructions (Addendum)
I recommended she go back into a thumb spica splint and see if we can calm some of this down. Considered steroids but will put her on Voltaren 75 mg twice daily with food. Gave her an Production manager. Encouraged her to call her hand and wrist physician, Dr. Stephenie Acres to be seen fairly soon to see whether or not an injection versus physical therapy or both would be indicated. Just supportive care for now, activity modification icing. Follow-up here as needed.

## 2020-08-01 NOTE — ED Provider Notes (Signed)
[facility]    CSN: [id number] Arrival date & time: 08/01/20  3664      History   Chief Complaint Chief Complaint  Patient presents with  . Wrist Pain    HPI Miranda Mcdonald is a 30 y.o. female.   Pleasant 30 year old right-hand-dominant female who presents for evaluation of the above issue.  On further history it appears as though she has had trouble with both wrists.  Her left wrist is not bothering her.  It appears as though she had a fracture to this right wrist back in May 2021.  There is no notes in the system and no radiology images or reports.  It appears as though she saw Dr. Stephenie Acres at [facility] in [place].  She reports she had x-rays and an MRI that did show a fracture.  She did not have surgery and it appears as though it healed without any incident.  She says now that she has been having right wrist pain with decreased range of motion for about a week.  No acute accidents trauma or falls.  There is a question of a little bit of swelling.  Most of her pain is along the distal radius and along the dorsal compartments.  She has had no changes in her activity.  Is worse with movement of her thumb.  She been taking over-the-counter NSAIDs with limited success.  She does have a thumb spica splint at home but she has not been using it.  She is not working outside the home at the present time.     Past Medical History:  Diagnosis Date  . Asthma   . Broken arm   . Closed fracture distal radius and ulna   . Closed right radial fracture   . Heart palpitations 03/19/2015  . Hypoglycemia   . Lactose intolerance in adult   . Migraines     Patient Active Problem List   Diagnosis Date Noted  . Acute anxiety 08/02/2019  . History of abnormal Pap smear 01/18/2018  . Migraine 02/22/2016  . Asthma, moderate persistent 01/14/2016    Past Surgical History:  Procedure Laterality Date  . CLAVICLE SURGERY Left 2007  . CLAVICLE SURGERY     surgery to remove pin from  first surgery  . KNEE SURGERY Left   . WISDOM TOOTH EXTRACTION      OB History    Gravida  1   Para  1   Term  1   Preterm      AB      Living  1     SAB      IAB      Ectopic      Multiple      Live Births  1            Home Medications    Prior to Admission medications   Medication Sig Start Date End Date Taking? Authorizing Provider  albuterol (VENTOLIN HFA) 108 (90 Base) MCG/ACT inhaler Inhale 2 puffs into the lungs every 6 (six) hours as needed for wheezing or shortness of breath. 05/09/20  Yes Johnson, Megan P, DO  busPIRone (BUSPAR) 5 MG tablet Take 1-3 tablets (5-15 mg total) by mouth 3 (three) times daily as needed. 05/09/20  Yes Johnson, Megan P, DO  diclofenac sodium (VOLTAREN) 1 % GEL Apply 4 g topically 4 (four) times daily. 05/16/19  Yes Johnson, Megan P, DO  Fluticasone-Salmeterol (ADVAIR DISKUS) 100-50 MCG/DOSE AEPB INHALE 1 PUFF INTO THE LUNGS 2 TIMES  DAILY. 05/09/20  Yes Johnson, Megan P, DO  ketorolac (TORADOL) 10 MG tablet Take 1 tablet (10 mg total) by mouth as needed. 02/27/20  Yes Johnson, Megan P, DO  promethazine (PHENERGAN) 25 MG tablet Take 1 tablet (25 mg total) by mouth every 8 (eight) hours as needed for nausea or vomiting. 05/09/20  Yes Johnson, Megan P, DO  sertraline (ZOLOFT) 100 MG tablet Take 1 tablet (100 mg total) by mouth daily. 05/09/20  Yes Johnson, Megan P, DO  SUMAtriptan (IMITREX) 100 MG tablet Take 1 tablet (100 mg total) by mouth every 2 (two) hours as needed for migraine. May repeat in 2 hours if headache persists or recurs. 11/17/19  Yes Johnson, Megan P, DO  SUMAtriptan 6 MG/0.5ML SOAJ Inject 6 mg into the skin daily as needed. For migraine 05/09/20  Yes Johnson, Megan P, DO  Tiotropium Bromide Monohydrate (SPIRIVA RESPIMAT) 2.5 MCG/ACT AERS Inhale 1 puff into the lungs daily. 05/09/20  Yes Johnson, Megan P, DO  VITAMIN D PO Take 1 tablet by mouth daily.   Yes [provider]  chlorpheniramine-HYDROcodone (TUSSIONEX  PENNKINETIC ER) 10-8 MG/5ML SUER Take 5 mLs by mouth every 12 (twelve) hours as needed. 07/18/20   Olevia Perches P, DO  predniSONE (DELTASONE) 10 MG tablet 6 tabs today, 5 tabs tomorrow, decrease by 1 every day until gone 07/18/20   Dorcas Carrow, DO    Family History Family History  Problem Relation Age of Onset  . Bipolar disorder Mother   . Hypothyroidism Mother   . Restless legs syndrome Mother   . Neuropathy Father   . Depression Father   . Bipolar disorder Sister   . Hypothyroidism Sister   . Bipolar disorder Brother   . Heart murmur Daughter   . Speech disorder Daughter   . Arthritis Maternal Grandmother   . Hypotension Maternal Grandmother   . Diabetes Maternal Grandfather   . Depression Maternal Grandfather   . Hyperlipidemia Maternal Grandfather   . Liver disease Maternal Grandfather        fatty liver  . Bipolar disorder Maternal Grandfather   . Hypothyroidism Maternal Grandfather   . Hypertension Paternal Grandmother   . Depression Paternal Grandmother   . Depression Paternal Grandfather   . Breast cancer Maternal Aunt   . Cancer Maternal Aunt        breast  . Breast cancer Other   . Breast cancer Other     Social History Social History   Tobacco Use  . Smoking status: Never Smoker  . Smokeless tobacco: Never Used  Vaping Use  . Vaping Use: Never used  Substance Use Topics  . Alcohol use: Yes    Comment: occasional- pt states once in a while  . Drug use: No     Allergies   Meloxicam, Morphine and related, and Percocet [oxycodone-acetaminophen]   Review of Systems Review of Systems  Musculoskeletal: Positive for arthralgias and joint swelling.  Skin: Negative for color change, pallor, rash and wound.  All other systems reviewed and are negative.    Physical Exam Triage Vital Signs ED Triage Vitals  Enc Vitals Group     BP 08/01/20 0952 114/79     Pulse Rate 08/01/20 0952 82     Resp 08/01/20 0952 18     Temp 08/01/20 0952 98 F (36.7  C)     Temp Source 08/01/20 0952 Oral     SpO2 08/01/20 0952 99 %     Weight 08/01/20 0953 220 lb (  99.8 kg)     Height 08/01/20 0953 5\' 8"  (1.727 m)     Head Circumference --      Peak Flow --      Pain Score 08/01/20 0953 7     Pain Loc --      Pain Edu? --      Excl. in GC? --    No data found.  Updated Vital Signs BP 114/79 (BP Location: Left Arm)   Pulse 82   Temp 98 F (36.7 C) (Oral)   Resp 18   Ht 5\' 8"  (1.727 m)   Wt 99.8 kg   LMP 07/04/2020 (Approximate)   SpO2 99%   BMI 33.45 kg/m   Visual Acuity Right Eye Distance:   Left Eye Distance:   Bilateral Distance:    Right Eye Near:   Left Eye Near:    Bilateral Near:     Physical Exam Vitals and nursing note reviewed.  Constitutional:      Appearance: Normal appearance.  HENT:     Head: Normocephalic.  Musculoskeletal:     Comments: Left wrist: Normal to inspection palpation range of motion special test.  Right wrist no obvious bony abnormality ecchymosis erythema soft tissue swelling.  She is tender to palpation over the dorsal compartments and over the distal radius.  There is no tenderness to palpation in the anatomical snuffbox.  Finkelstein's test is positive.  There is no crepitus.  She has decreased range of motion in all planes but this is mostly due to apprehension.  There is no wrist instability.  She has no tenderness over the TFCC or the distal ulna.  Grip strength is mildly reduced.  No evidence of any tendon retraction.  Her strength is well-preserved in all planes but does have some discomfort.  Neurovascular: Normal sensation 2+ pulses distally.  Neurological:     Mental Status: She is alert.      UC Treatments / Results  Labs (all labs ordered are listed, but only abnormal results are displayed) Labs Reviewed - No data to display  EKG   Radiology DG Wrist Complete Right  Result Date: 08/01/2020 CLINICAL DATA:  Pain. Reported fracture approximately 8 months prior EXAM: RIGHT WRIST  - COMPLETE 3+ VIEW COMPARISON:  None. FINDINGS: Frontal, oblique, lateral, and ulnar deviation scaphoid images were obtained. No acute fracture or dislocation. Calcification in the ulnar styloid region is likely due to previous avulsion. No appreciable joint space narrowing or erosion. IMPRESSION: Evidence of old avulsion of the ulnar styloid. No acute fracture or dislocation. No appreciable underlying arthropathy. Electronically Signed   By: Bretta Bang III M.D.   On: 08/01/2020 10:15    Procedures Procedures (including critical care time)  Medications Ordered in UC Medications - No data to display  Initial Impression / Assessment and Plan / UC Course  I have reviewed the triage vital signs and the nursing notes.  Pertinent labs & imaging results that were available during my care of the patient were reviewed by me and considered in my medical decision making (see chart for details).  Clinical impression: Right wrist pain with a background history of a remote fracture.  X-ray as above.  No acute findings.  Physical exam is consistent with de Quervain's tenosynovitis.  Treatment plan: 1.  The findings and treatment plan were discussed in detail with the patient.  Patient was in agreement. 2.  I recommended she go back into a thumb spica splint and see if we can calm  some of this down. 3.  Considered steroids but will put her on Voltaren 75 mg twice daily with food. 4.  Gave her an Production manager. 5.  Encouraged her to call her hand and wrist physician, Dr. Stephenie Acres to be seen fairly soon to see whether or not an injection versus physical therapy or both would be indicated. 6.  Just supportive care for now, activity modification icing. 7.  Follow-up here as needed.    Final Clinical Impressions(s) / UC Diagnoses   Final diagnoses:  Right wrist pain  De Quervain's disease (radial styloid tenosynovitis)     Discharge Instructions     I recommended she go back into a thumb  spica splint and see if we can calm some of this down. Considered steroids but will put her on Voltaren 75 mg twice daily with food. Gave her an Production manager. Encouraged her to call her hand and wrist physician, Dr. Stephenie Acres to be seen fairly soon to see whether or not an injection versus physical therapy or both would be indicated. Just supportive care for now, activity modification icing. Follow-up here as needed.    ED Prescriptions    None     PDMP not reviewed this encounter.   Delton See, MD 08/01/20 1105

## 2020-08-13 ENCOUNTER — Other Ambulatory Visit: Payer: Self-pay | Admitting: Family Medicine

## 2020-08-30 ENCOUNTER — Other Ambulatory Visit: Payer: Self-pay

## 2020-08-30 ENCOUNTER — Encounter: Payer: Self-pay | Admitting: Family Medicine

## 2020-08-30 ENCOUNTER — Other Ambulatory Visit (HOSPITAL_COMMUNITY)
Admission: RE | Admit: 2020-08-30 | Discharge: 2020-08-30 | Disposition: A | Discharge status: Home / Self Care | Payer: 59 | Source: Ambulatory Visit | Attending: Family Medicine | Admitting: Family Medicine

## 2020-08-30 ENCOUNTER — Ambulatory Visit (INDEPENDENT_AMBULATORY_CARE_PROVIDER_SITE_OTHER): Payer: 59 | Admitting: Family Medicine

## 2020-08-30 VITALS — BP 119/79 | HR 81 | Temp 98.9°F | Ht 68.75 in | Wt 236.2 lb

## 2020-08-30 DIAGNOSIS — Z0000 Encounter for general adult medical examination without abnormal findings: Secondary | ICD-10-CM | POA: Insufficient documentation

## 2020-08-30 LAB — URINALYSIS, ROUTINE W REFLEX MICROSCOPIC
Bilirubin, UA: NEGATIVE
Glucose, UA: NEGATIVE
Ketones, UA: NEGATIVE
Leukocytes,UA: NEGATIVE
Nitrite, UA: NEGATIVE
Protein,UA: NEGATIVE
RBC, UA: NEGATIVE
Specific Gravity, UA: 1.02 (ref 1.005–1.030)
Urobilinogen, Ur: 0.2 mg/dL (ref 0.2–1.0)
pH, UA: 6 (ref 5.0–7.5)

## 2020-08-30 MED ORDER — KETOROLAC TROMETHAMINE 10 MG PO TABS: 10.0000 mg | ORAL_TABLET | ORAL | 3 refills | Status: DC | PRN

## 2020-08-30 MED ORDER — BUSPIRONE HCL 5 MG PO TABS: ORAL_TABLET | ORAL | 1 refills | Status: DC

## 2020-08-30 NOTE — Progress Notes (Signed)
BP 119/79   Pulse 81   Temp 98.9 F (37.2 C)   Ht 5' 8.75" (1.746 m)   Wt 236 lb 3.2 oz (107.1 kg)   SpO2 99%   BMI 35.13 kg/m    Subjective:    Patient ID: Miranda Mcdonald, female    DOB: [birth year], 30 y.o.   MRN: [id number]  HPI: Miranda Mcdonald is a 30 y.o. female presenting on 08/30/2020 for comprehensive medical examination. Current medical complaints include:none  She currently lives with: husband and daughter Menopausal Symptoms: no  Depression Screen done today and results listed below:  Depression screen [facility] 2/9 05/09/2020 02/27/2020 12/26/2019 08/02/2019 05/16/2019  Decreased Interest 2 1 1 1  0  Down, Depressed, Hopeless 2 1 1 1 1   PHQ - 2 Score 4 2 2 2 1   Altered sleeping 3 2 2  0 1  Tired, decreased energy 1 1 1 1 1   Change in appetite 3 1 1 2 2   Feeling bad or failure about yourself  2 1 1  0 2  Trouble concentrating 1 0 1 2 3   Moving slowly or fidgety/restless 1 1 1  0 0  Suicidal thoughts 1 0 0 0 -  PHQ-9 Score 16 8 9 7 10   Difficult doing work/chores Very difficult Somewhat difficult Somewhat difficult Somewhat difficult Somewhat difficult    Past Medical History:  Past Medical History:  Diagnosis Date  . Asthma   . Broken arm   . Closed fracture distal radius and ulna   . Closed right radial fracture   . Heart palpitations 03/19/2015  . Hypoglycemia   . Lactose intolerance in adult   . Migraines     Surgical History:  Past Surgical History:  Procedure Laterality Date  . CLAVICLE SURGERY Left 2007  . CLAVICLE SURGERY     surgery to remove pin from first surgery  . KNEE SURGERY Left   . WISDOM TOOTH EXTRACTION      Medications:  Current Outpatient Medications on File Prior to Visit  Medication Sig  . albuterol (VENTOLIN HFA) 108 (90 Base) MCG/ACT inhaler Inhale 2 puffs into the lungs every 6 (six) hours as needed for wheezing or shortness of breath.  . diclofenac (VOLTAREN) 75 MG EC tablet Take 1 tablet (75 mg total) by mouth 2 (two) times daily.  .  diclofenac sodium (VOLTAREN) 1 % GEL Apply 4 g topically 4 (four) times daily.  . Fluticasone-Salmeterol (ADVAIR DISKUS) 100-50 MCG/DOSE AEPB INHALE 1 PUFF INTO THE LUNGS 2 TIMES DAILY.  promethazine (PHENERGAN) 25 MG tablet Take 1 tablet (25 mg total) by mouth every 8 (eight) hours as needed for nausea or vomiting.  . sertraline (ZOLOFT) 100 MG tablet Take 1 tablet (100 mg total) by mouth daily.  . SUMAtriptan (IMITREX) 100 MG tablet Take 1 tablet (100 mg total) by mouth every 2 (two) hours as needed for migraine. May repeat in 2 hours if headache persists or recurs.  . SUMAtriptan 6 MG/0.5ML SOAJ Inject 6 mg into the skin daily as needed. For migraine  . Tiotropium Bromide Monohydrate (SPIRIVA RESPIMAT) 2.5 MCG/ACT AERS Inhale 1 puff into the lungs daily.  VITAMIN D PO Take 1 tablet by mouth daily.   No current facility-administered medications on file prior to visit.    Allergies:  Allergies  Allergen Reactions  . Meloxicam Shortness Of Breath and Other (See Comments)    Difficulty breathing   . Morphine And Related Other (See Comments)    Mean, angry, violent   .  Percocet [Oxycodone-Acetaminophen] Other (See Comments)    Migraine    Social History:  Social History   Socioeconomic History  . Marital status: Married    Spouse name: Not on file  . Number of children: Not on file  . Years of education: Not on file  . Highest education level: Not on file  Occupational History  . Not on file  Tobacco Use  . Smoking status: Never Smoker  . Smokeless tobacco: Never Used  Vaping Use  . Vaping Use: Never used  Substance and Sexual Activity  . Alcohol use: Yes    Comment: occasional- pt states once in a while  . Drug use: No  . Sexual activity: Yes    Birth control/protection: None  Other Topics Concern  . Not on file  Social History Narrative  . Not on file   Social Determinants of Health   Financial Resource Strain: Not on file  Food Insecurity: Not on file   Transportation Needs: Not on file  Physical Activity: Not on file  Stress: Not on file  Social Connections: Not on file  Intimate Partner Violence: Not on file   Social History   Tobacco Use  Smoking Status Never Smoker  Smokeless Tobacco Never Used   Social History   Substance and Sexual Activity  Alcohol Use Yes   Comment: occasional- pt states once in a while    Family History:  Family History  Problem Relation Age of Onset  . Bipolar disorder Mother   . Hypothyroidism Mother   . Restless legs syndrome Mother   . Neuropathy Father   . Depression Father   . Bipolar disorder Sister   . Hypothyroidism Sister   . Bipolar disorder Brother   . Heart murmur Daughter   . Speech disorder Daughter   . Arthritis Maternal Grandmother   . Hypotension Maternal Grandmother   . Diabetes Maternal Grandfather   . Depression Maternal Grandfather   . Hyperlipidemia Maternal Grandfather   . Liver disease Maternal Grandfather        fatty liver  . Bipolar disorder Maternal Grandfather   . Hypothyroidism Maternal Grandfather   . Hypertension Paternal Grandmother   . Depression Paternal Grandmother   . Depression Paternal Grandfather   . Breast cancer Maternal Aunt   . Cancer Maternal Aunt        breast  . Breast cancer Other   . Breast cancer Other     Past medical history, surgical history, medications, allergies, family history and social history reviewed with patient today and changes made to appropriate areas of the chart.   Review of Systems  Constitutional: Negative.   HENT: Negative.   Eyes: Positive for blurred vision. Negative for double vision, photophobia, pain, discharge and redness.  Respiratory: Negative.   Cardiovascular: Positive for palpitations. Negative for chest pain, orthopnea, claudication, leg swelling and PND.  Gastrointestinal: Positive for nausea. Negative for abdominal pain, blood in stool, constipation, diarrhea, heartburn, melena and vomiting.   Genitourinary: Negative.   Musculoskeletal: Positive for joint pain. Negative for back pain, falls, myalgias and neck pain.  Skin: Negative.   Neurological: Negative.   Endo/Heme/Allergies: Positive for environmental allergies. Negative for polydipsia. Does not bruise/bleed easily.  Psychiatric/Behavioral: Negative.    All other ROS negative except what is listed above and in the HPI.      Objective:    BP 119/79   Pulse 81   Temp 98.9 F (37.2 C)   Ht 5' 8.75" (1.746 m)  Wt 236 lb 3.2 oz (107.1 kg)   SpO2 99%   BMI 35.13 kg/m   Wt Readings from Last 3 Encounters:  08/30/20 236 lb 3.2 oz (107.1 kg)  08/01/20 220 lb (99.8 kg)  05/09/20 223 lb 6.4 oz (101.3 kg)    Physical Exam Vitals and nursing note reviewed. Exam conducted with a chaperone present.  Constitutional:      General: She is not in acute distress.    Appearance: Normal appearance. She is not ill-appearing, toxic-appearing or diaphoretic.  HENT:     Head: Normocephalic and atraumatic.     Right Ear: Tympanic membrane, ear canal and external ear normal. There is no impacted cerumen.     Left Ear: Tympanic membrane, ear canal and external ear normal. There is no impacted cerumen.     Nose: Nose normal. No congestion or rhinorrhea.     Mouth/Throat:     Mouth: Mucous membranes are moist.     Pharynx: Oropharynx is clear. No oropharyngeal exudate or posterior oropharyngeal erythema.  Eyes:     General: No scleral icterus.       Right eye: No discharge.        Left eye: No discharge.     Extraocular Movements: Extraocular movements intact.     Conjunctiva/sclera: Conjunctivae normal.     Pupils: Pupils are equal, round, and reactive to light.  Neck:     Vascular: No carotid bruit.  Cardiovascular:     Rate and Rhythm: Normal rate and regular rhythm.     Pulses: Normal pulses.     Heart sounds: No murmur heard. No friction rub. No gallop.   Pulmonary:     Effort: Pulmonary effort is normal. No respiratory  distress.     Breath sounds: Normal breath sounds. No stridor. No wheezing, rhonchi or rales.  Chest:     Chest wall: No tenderness.  Breasts:     Right: Normal. No swelling, bleeding, inverted nipple, mass, nipple discharge, skin change, tenderness, axillary adenopathy or supraclavicular adenopathy.     Left: Normal. No swelling, bleeding, inverted nipple, mass, nipple discharge, skin change, tenderness, axillary adenopathy or supraclavicular adenopathy.    Abdominal:     General: Abdomen is flat. Bowel sounds are normal. There is no distension.     Palpations: Abdomen is soft. There is no mass.     Tenderness: There is no abdominal tenderness. There is no right CVA tenderness, left CVA tenderness, guarding or rebound.     Hernia: No hernia is present.  Genitourinary:    General: Normal vulva.     Labia:        Right: No rash, tenderness, lesion or injury.        Left: No rash, tenderness, lesion or injury.      Urethra: No prolapse, urethral pain, urethral swelling or urethral lesion.     Vagina: Normal.     Cervix: Normal.     Uterus: Normal.      Adnexa: Right adnexa normal and left adnexa normal.  Musculoskeletal:        General: No swelling, tenderness, deformity or signs of injury.     Cervical back: Normal range of motion and neck supple. No rigidity. No muscular tenderness.     Right lower leg: No edema.     Left lower leg: No edema.  Lymphadenopathy:     Cervical: No cervical adenopathy.     Upper Body:     Right upper body: No supraclavicular, axillary  or pectoral adenopathy.     Left upper body: No supraclavicular, axillary or pectoral adenopathy.  Skin:    General: Skin is warm and dry.     Capillary Refill: Capillary refill takes less than 2 seconds.     Coloration: Skin is not jaundiced or pale.     Findings: No bruising, erythema, lesion or rash.  Neurological:     General: No focal deficit present.     Mental Status: She is alert and oriented to person,  place, and time. Mental status is at baseline.     Cranial Nerves: No cranial nerve deficit.     Sensory: No sensory deficit.     Motor: No weakness.     Coordination: Coordination normal.     Gait: Gait normal.     Deep Tendon Reflexes: Reflexes normal.  Psychiatric:        Mood and Affect: Mood normal.        Behavior: Behavior normal.        Thought Content: Thought content normal.        Judgment: Judgment normal.     Results for orders placed or performed in visit on 07/18/20  Novel Coronavirus, NAA ([facility])   Specimen: Saline  Result Value Ref Range   SARS-CoV-2, NAA Not Detected Not Detected  SARS-COV-2, NAA 2 DAY TAT  Result Value Ref Range   SARS-CoV-2, NAA 2 DAY TAT Performed       Assessment & Plan:   Problem List Items Addressed This Visit   None   Visit Diagnoses    Routine general medical examination at a health care facility    -  Primary   Vaccines up to date. Screening labs checked today. Continue diet and exercise. Call with any concerns. Continue to monitor.    Relevant Orders   CBC with Differential/Platelet   Comprehensive metabolic panel   Lipid Panel w/o Chol/HDL Ratio   Cytology - PAP   Urinalysis, Routine w reflex microscopic   TSH       Follow up plan: Return May, for 6 month follow up.   LABORATORY TESTING:  - Pap smear: pap done  IMMUNIZATIONS:   - Tdap: Tetanus vaccination status reviewed: last tetanus booster within 10 years. - Influenza: Refused - Pneumovax: Not applicable - COVID: Refused  PATIENT COUNSELING:   Advised to take 1 mg of folate supplement per day if capable of pregnancy.   Sexuality: Discussed sexually transmitted diseases, partner selection, use of condoms, avoidance of unintended pregnancy  and contraceptive alternatives.   Advised to avoid cigarette smoking.  I discussed with the patient that most people either abstain from alcohol or drink within safe limits (<=14/week and <=4 drinks/occasion for males,  <=7/weeks and <= 3 drinks/occasion for females) and that the risk for alcohol disorders and other health effects rises proportionally with the number of drinks per week and how often a drinker exceeds daily limits.  Discussed cessation/primary prevention of drug use and availability of treatment for abuse.   Diet: Encouraged to adjust caloric intake to maintain  or achieve ideal body weight, to reduce intake of dietary saturated fat and total fat, to limit sodium intake by avoiding high sodium foods and not adding table salt, and to maintain adequate dietary potassium and calcium preferably from fresh fruits, vegetables, and low-fat dairy products.    stressed the importance of regular exercise  Injury prevention: Discussed safety belts, safety helmets, smoke detector, smoking near bedding or upholstery.   Dental health:  Discussed importance of regular tooth brushing, flossing, and dental visits.    NEXT PREVENTATIVE PHYSICAL DUE IN 1 YEAR. Return May, for 6 month follow up.

## 2020-08-30 NOTE — Patient Instructions (Signed)
Health Maintenance, Female Adopting a healthy lifestyle and getting preventive care are important in promoting health and wellness. Ask your health care provider about:  The right schedule for you to have regular tests and exams.  Things you can do on your own to prevent diseases and keep yourself healthy. What should I know about diet, weight, and exercise? Eat a healthy diet  Eat a diet that includes plenty of vegetables, fruits, low-fat dairy products, and lean protein.  Do not eat a lot of foods that are high in solid fats, added sugars, or sodium.   Maintain a healthy weight Body mass index (BMI) is used to identify weight problems. It estimates body fat based on height and weight. Your health care provider can help determine your BMI and help you achieve or maintain a healthy weight. Get regular exercise Get regular exercise. This is one of the most important things you can do for your health. Most adults should:  Exercise for at least 150 minutes each week. The exercise should increase your heart rate and make you sweat (moderate-intensity exercise).  Do strengthening exercises at least twice a week. This is in addition to the moderate-intensity exercise.  Spend less time sitting. Even light physical activity can be beneficial. Watch cholesterol and blood lipids Have your blood tested for lipids and cholesterol at 32 years of age, then have this test every 5 years. Have your cholesterol levels checked more often if:  Your lipid or cholesterol levels are high.  You are older than 32 years of age.  You are at high risk for heart disease. What should I know about cancer screening? Depending on your health history and family history, you may need to have cancer screening at various ages. This may include screening for:  Breast cancer.  Cervical cancer.  Colorectal cancer.  Skin cancer.  Lung cancer. What should I know about heart disease, diabetes, and high blood  pressure? Blood pressure and heart disease  High blood pressure causes heart disease and increases the risk of stroke. This is more likely to develop in people who have high blood pressure readings, are of African descent, or are overweight.  Have your blood pressure checked: ? Every 3-5 years if you are 18-39 years of age. ? Every year if you are 40 years old or older. Diabetes Have regular diabetes screenings. This checks your fasting blood sugar level. Have the screening done:  Once every three years after age 40 if you are at a normal weight and have a low risk for diabetes.  More often and at a younger age if you are overweight or have a high risk for diabetes. What should I know about preventing infection? Hepatitis B If you have a higher risk for hepatitis B, you should be screened for this virus. Talk with your health care provider to find out if you are at risk for hepatitis B infection. Hepatitis C Testing is recommended for:  Everyone born from 1945 through 1965.  Anyone with known risk factors for hepatitis C. Sexually transmitted infections (STIs)  Get screened for STIs, including gonorrhea and chlamydia, if: ? You are sexually active and are younger than 32 years of age. ? You are older than 32 years of age and your health care provider tells you that you are at risk for this type of infection. ? Your sexual activity has changed since you were last screened, and you are at increased risk for chlamydia or gonorrhea. Ask your health care provider   if you are at risk.  Ask your health care provider about whether you are at high risk for HIV. Your health care provider may recommend a prescription medicine to help prevent HIV infection. If you choose to take medicine to prevent HIV, you should first get tested for HIV. You should then be tested every 3 months for as long as you are taking the medicine. Pregnancy  If you are about to stop having your period (premenopausal) and  you may become pregnant, seek counseling before you get pregnant.  Take 400 to 800 micrograms (mcg) of folic acid every day if you become pregnant.  Ask for birth control (contraception) if you want to prevent pregnancy. Osteoporosis and menopause Osteoporosis is a disease in which the bones lose minerals and strength with aging. This can result in bone fractures. If you are 65 years old or older, or if you are at risk for osteoporosis and fractures, ask your health care provider if you should:  Be screened for bone loss.  Take a calcium or vitamin D supplement to lower your risk of fractures.  Be given hormone replacement therapy (HRT) to treat symptoms of menopause. Follow these instructions at home: Lifestyle  Do not use any products that contain nicotine or tobacco, such as cigarettes, e-cigarettes, and chewing tobacco. If you need help quitting, ask your health care provider.  Do not use street drugs.  Do not share needles.  Ask your health care provider for help if you need support or information about quitting drugs. Alcohol use  Do not drink alcohol if: ? Your health care provider tells you not to drink. ? You are pregnant, may be pregnant, or are planning to become pregnant.  If you drink alcohol: ? Limit how much you use to 0-1 drink a day. ? Limit intake if you are breastfeeding.  Be aware of how much alcohol is in your drink. In the [place] one drink equals one 12 oz bottle of beer (355 mL), one 5 oz glass of wine (148 mL), or one 1 oz glass of hard liquor (44 mL). General instructions  Schedule regular health, dental, and eye exams.  Stay current with your vaccines.  Tell your health care provider if: ? You often feel depressed. ? You have ever been abused or do not feel safe at home. Summary  Adopting a healthy lifestyle and getting preventive care are important in promoting health and wellness.  Follow your health care provider's instructions about healthy  diet, exercising, and getting tested or screened for diseases.  Follow your health care provider's instructions on monitoring your cholesterol and blood pressure. This information is not intended to replace advice given to you by your health care provider. Make sure you discuss any questions you have with your health care provider. Document Revised: 06/16/2018 Document Reviewed: 06/16/2018 Elsevier Patient Education  2021 [employer]vier Inc.  

## 2020-08-31 LAB — LIPID PANEL W/O CHOL/HDL RATIO
Cholesterol, Total: 199 mg/dL (ref 100–199)
HDL: 48 mg/dL (ref >39)
LDL Chol Calc (NIH): 127 mg/dL — ABNORMAL HIGH (ref 0–99)
Triglycerides: 137 mg/dL (ref 0–149)
VLDL Cholesterol Cal: 24 mg/dL (ref 5–40)

## 2020-08-31 LAB — CBC WITH DIFFERENTIAL/PLATELET
Basophils Absolute: 0 10*3/uL (ref 0.0–0.2)
Basos: 1 %
EOS (ABSOLUTE): 0.1 10*3/uL (ref 0.0–0.4)
Eos: 1 %
Hematocrit: 41.1 % (ref 34.0–46.6)
Hemoglobin: 13.5 g/dL (ref 11.1–15.9)
Immature Grans (Abs): 0 10*3/uL (ref 0.0–0.1)
Immature Granulocytes: 0 %
Lymphocytes Absolute: 2 10*3/uL (ref 0.7–3.1)
Lymphs: 33 %
MCH: 28.5 pg (ref 26.6–33.0)
MCHC: 32.8 g/dL (ref 31.5–35.7)
MCV: 87 fL (ref 79–97)
Monocytes Absolute: 0.4 10*3/uL (ref 0.1–0.9)
Monocytes: 7 %
Neutrophils Absolute: 3.7 10*3/uL (ref 1.4–7.0)
Neutrophils: 58 %
Platelets: 271 10*3/uL (ref 150–450)
RBC: 4.73 x10E6/uL (ref 3.77–5.28)
RDW: 12.6 % (ref 11.7–15.4)
WBC: 6.2 10*3/uL (ref 3.4–10.8)

## 2020-08-31 LAB — COMPREHENSIVE METABOLIC PANEL
ALT: 12 IU/L (ref 0–32)
AST: 15 IU/L (ref 0–40)
Albumin/Globulin Ratio: 1.7 (ref 1.2–2.2)
Albumin: 4.3 g/dL (ref 3.8–4.8)
Alkaline Phosphatase: 75 IU/L (ref 44–121)
BUN/Creatinine Ratio: 11 (ref 9–23)
BUN: 9 mg/dL (ref 6–20)
Bilirubin Total: <0.2 mg/dL (ref 0.0–1.2)
CO2: 23 mmol/L (ref 20–29)
Calcium: 9.6 mg/dL (ref 8.7–10.2)
Chloride: 100 mmol/L (ref 96–106)
Creatinine, Ser: 0.82 mg/dL (ref 0.57–1.00)
GFR calc Af Amer: 110 mL/min/{1.73_m2} (ref >59)
GFR calc non Af Amer: 96 mL/min/{1.73_m2} (ref >59)
Globulin, Total: 2.6 g/dL (ref 1.5–4.5)
Glucose: 83 mg/dL (ref 65–99)
Potassium: 3.9 mmol/L (ref 3.5–5.2)
Sodium: 140 mmol/L (ref 134–144)
Total Protein: 6.9 g/dL (ref 6.0–8.5)

## 2020-08-31 LAB — TSH: TSH: 1.47 u[IU]/mL (ref 0.450–4.500)

## 2020-09-03 LAB — CYTOLOGY - PAP
Adequacy: ABSENT
Comment: NEGATIVE
Diagnosis: NEGATIVE
High risk HPV: NEGATIVE

## 2020-09-11 ENCOUNTER — Encounter: Payer: Self-pay | Admitting: Family Medicine

## 2020-09-14 ENCOUNTER — Other Ambulatory Visit: Payer: Self-pay | Admitting: Family Medicine

## 2020-09-14 MED ORDER — SPIRIVA RESPIMAT 2.5 MCG/ACT IN AERS: 1.0000 | INHALATION_SPRAY | Freq: Every day | RESPIRATORY_TRACT | 12 refills | Status: DC

## 2020-12-11 ENCOUNTER — Telehealth: Payer: Self-pay

## 2020-12-11 NOTE — Telephone Encounter (Signed)
Copied from CRM [id number]. Topic: General - Call Back - No Documentation >> Dec 11, 2020 11:55 AM [name] wrote: Reason for CRM: Pt is requesting a call back from the nurse, she is switching a few medications to an online pharmacy and will be dropping off a form for her PCP. Wants to speak to nurse first   [facility] online,  Best contact: [phone]

## 2020-12-11 NOTE — Telephone Encounter (Signed)
Called and spoke with patient, she will drop the form off to switch some of medications to online pharmacy.

## 2020-12-13 ENCOUNTER — Encounter: Payer: Self-pay | Admitting: Family Medicine

## 2020-12-20 ENCOUNTER — Telehealth: Payer: Self-pay | Admitting: Family Medicine

## 2020-12-20 NOTE — Telephone Encounter (Signed)
[facility] called in stating confirmation as far as how the pt needs to take the medication.   SUMAtriptan 6 MG/0.5ML SOAJ  (max daily dose?)  ketorolac (TORADOL) 10 MG tablet - (how many days should she be on it?)  Please advise: [facility]  [phone]

## 2020-12-27 ENCOUNTER — Ambulatory Visit: Payer: Self-pay | Admitting: *Deleted

## 2020-12-27 ENCOUNTER — Telehealth: Payer: Self-pay

## 2020-12-27 MED ORDER — SUMATRIPTAN SUCCINATE 100 MG PO TABS
100.0000 mg | ORAL_TABLET | ORAL | 12 refills | Status: DC | PRN
Start: 2020-12-27 — End: 2022-12-26

## 2020-12-27 MED ORDER — KETOROLAC TROMETHAMINE 10 MG PO TABS: 10.0000 mg | ORAL_TABLET | ORAL | 3 refills | Status: DC | PRN

## 2020-12-27 NOTE — Telephone Encounter (Signed)
See other phone encounter.  

## 2020-12-27 NOTE — Telephone Encounter (Signed)
Please advise 

## 2020-12-27 NOTE — Addendum Note (Signed)
Addended by: Dorcas Carrow on: 12/27/2020 10:07 PM   Modules accepted: Orders

## 2020-12-27 NOTE — Telephone Encounter (Signed)
Call received from [facility] , from [place]; requesting clarification of medications. Please clarify maximum daily dose for Sumatriptan and maximum days kertorolac can be given. Please call pharmacists ASAP at #[phone].

## 2020-12-27 NOTE — Telephone Encounter (Signed)
Copied from CRM [id number]. Topic: General - Other >> Dec 26, 2020  5:26 PM Marylen Ponto wrote: Reason for CRM: [facility] called for an update on clarification of Rx for SUMAtriptan (IMITREX) 100 MG tablet and ketorolac (TORADOL) 10 MG tablet. [facility]  Ph:[phone]  Fax:[phone]

## 2020-12-27 NOTE — Telephone Encounter (Signed)
Routing to provider for clarification on prescriptions.

## 2021-01-16 ENCOUNTER — Encounter: Payer: Self-pay | Admitting: Family Medicine

## 2021-02-13 ENCOUNTER — Other Ambulatory Visit: Payer: Self-pay | Admitting: Family Medicine

## 2021-02-13 ENCOUNTER — Telehealth: Payer: 59 | Admitting: *Deleted

## 2021-02-13 NOTE — Telephone Encounter (Signed)
Pt left Prescription Request Form  for Dr. Laural Benes to be completed. Placed in folder for review

## 2021-02-13 NOTE — Telephone Encounter (Signed)
Ready for signature

## 2021-02-19 ENCOUNTER — Other Ambulatory Visit: Payer: Self-pay

## 2021-02-19 MED ORDER — FLUTICASONE-SALMETEROL 100-50 MCG/ACT IN AEPB: 1.0000 | INHALATION_SPRAY | Freq: Two times a day (BID) | RESPIRATORY_TRACT | 3 refills | Status: DC

## 2021-02-28 ENCOUNTER — Ambulatory Visit: Payer: 59 | Admitting: Family Medicine

## 2021-03-04 ENCOUNTER — Encounter: Payer: Self-pay | Admitting: Family Medicine

## 2021-03-04 ENCOUNTER — Other Ambulatory Visit: Payer: Self-pay

## 2021-03-04 ENCOUNTER — Ambulatory Visit: Payer: No Typology Code available for payment source | Admitting: Family Medicine

## 2021-03-04 VITALS — BP 119/80 | HR 94 | Temp 99.3°F | Ht 68.74 in | Wt 242.0 lb

## 2021-03-04 DIAGNOSIS — F419 Anxiety disorder, unspecified: Secondary | ICD-10-CM

## 2021-03-04 DIAGNOSIS — G43109 Migraine with aura, not intractable, without status migrainosus: Secondary | ICD-10-CM

## 2021-03-04 DIAGNOSIS — J4540 Moderate persistent asthma, uncomplicated: Secondary | ICD-10-CM

## 2021-03-04 DIAGNOSIS — R1011 Right upper quadrant pain: Secondary | ICD-10-CM | POA: Diagnosis not present

## 2021-03-04 DIAGNOSIS — E559 Vitamin D deficiency, unspecified: Secondary | ICD-10-CM | POA: Diagnosis not present

## 2021-03-04 NOTE — Progress Notes (Signed)
BP 119/80   Pulse 94   Temp 99.3 F (37.4 C) (Oral)   Ht 5' 8.74" (1.746 m)   Wt 242 lb (109.8 kg)   SpO2 98%   BMI 36.01 kg/m    Subjective:    Patient ID: Miranda Mcdonald, female    DOB: 03-11-1991, 30 y.o.   MRN: [id number]  HPI: Miranda Mcdonald is a 30 y.o. female  Chief Complaint  Patient presents with   Anxiety   Asthma   ANXIETY/STRESS Duration: chronic Status:stable Anxious mood: yes  Excessive worrying: yes Irritability: yes  Sweating: no Nausea: no Palpitations:no Hyperventilation: no Panic attacks: yes Agoraphobia: no  Obscessions/compulsions: no Depressed mood: yes Depression screen [facility] 2/9 03/04/2021 05/09/2020 02/27/2020 12/26/2019 08/02/2019  Decreased Interest 0 2 1 1 1   Down, Depressed, Hopeless 1 2 1 1 1   PHQ - 2 Score 1 4 2 2 2   Altered sleeping 3 3 2 2  0  Tired, decreased energy 0 1 1 1 1   Change in appetite 1 3 1 1 2   Feeling bad or failure about yourself  1 2 1 1  0  Trouble concentrating 3 1 0 1 2  Moving slowly or fidgety/restless 0 1 1 1  0  Suicidal thoughts 0 1 0 0 0  PHQ-9 Score 9 16 8 9 7   Difficult doing work/chores Not difficult at all Very difficult Somewhat difficult Somewhat difficult Somewhat difficult   Anhedonia: no Weight changes: no Insomnia: yes hard to fall asleep  Hypersomnia: no Fatigue/loss of energy: yes Feelings of worthlessness: no Feelings of guilt: yes Impaired concentration/indecisiveness: no Suicidal ideations: no  Crying spells: no Recent Stressors/Life Changes: yes   Relationship problems: no   Family stress: yes     Financial stress: yes    Job stress: yes    Recent death/loss: no  ASTHMA Asthma status: stable Satisfied with current treatment?: yes Albuterol/rescue inhaler frequency: rarely Dyspnea frequency: with exposure to cat Wheezing frequency: rarely Cough frequency: rarely Nocturnal symptom frequency: with exposure to cat  Limitation of activity: no Current upper respiratory symptoms:  no Triggers: new cat Aerochamber/spacer use: no Visits to ER or Urgent Care in past year: no Pneumovax: Up to Date Influenza: Up to Date  Relevant past medical, surgical, family and social history reviewed and updated as indicated. Interim medical history since our last visit reviewed. Allergies and medications reviewed and updated.  Review of Systems  Constitutional: Negative.   Respiratory: Negative.    Cardiovascular: Negative.   Gastrointestinal:  Positive for abdominal pain. Negative for abdominal distention, anal bleeding, blood in stool, constipation, diarrhea, nausea, rectal pain and vomiting.  Musculoskeletal:  Positive for myalgias. Negative for arthralgias, back pain, gait problem, joint swelling, neck pain and neck stiffness.  Psychiatric/Behavioral: Negative.     Per HPI unless specifically indicated above     Objective:    BP 119/80   Pulse 94   Temp 99.3 F (37.4 C) (Oral)   Ht 5' 8.74" (1.746 m)   Wt 242 lb (109.8 kg)   SpO2 98%   BMI 36.01 kg/m   Wt Readings from Last 3 Encounters:  03/04/21 242 lb (109.8 kg)  08/30/20 236 lb 3.2 oz (107.1 kg)  08/01/20 220 lb (99.8 kg)    Physical Exam Vitals and nursing note reviewed.  Constitutional:      General: She is not in acute distress.    Appearance: Normal appearance. She is not ill-appearing, toxic-appearing or diaphoretic.  HENT:     Head: Normocephalic  and atraumatic.     Right Ear: External ear normal.     Left Ear: External ear normal.     Nose: Nose normal.     Mouth/Throat:     Mouth: Mucous membranes are moist.     Pharynx: Oropharynx is clear.  Eyes:     General: No scleral icterus.       Right eye: No discharge.        Left eye: No discharge.     Extraocular Movements: Extraocular movements intact.     Conjunctiva/sclera: Conjunctivae normal.     Pupils: Pupils are equal, round, and reactive to light.  Cardiovascular:     Rate and Rhythm: Normal rate and regular rhythm.     Pulses:  Normal pulses.     Heart sounds: Normal heart sounds. No murmur heard.   No friction rub. No gallop.  Pulmonary:     Effort: Pulmonary effort is normal. No respiratory distress.     Breath sounds: Normal breath sounds. No stridor. No wheezing, rhonchi or rales.  Chest:     Chest wall: No tenderness.  Musculoskeletal:        General: Normal range of motion.     Cervical back: Normal range of motion and neck supple.  Skin:    General: Skin is warm and dry.     Capillary Refill: Capillary refill takes less than 2 seconds.     Coloration: Skin is not jaundiced or pale.     Findings: No bruising, erythema, lesion or rash.  Neurological:     General: No focal deficit present.     Mental Status: She is alert and oriented to person, place, and time. Mental status is at baseline.  Psychiatric:        Mood and Affect: Mood normal.        Behavior: Behavior normal.        Thought Content: Thought content normal.        Judgment: Judgment normal.    Results for orders placed or performed in visit on 08/30/20  CBC with Differential/Platelet  Result Value Ref Range   WBC 6.2 3.4 - 10.8 x10E3/uL   RBC 4.73 3.77 - 5.28 x10E6/uL   Hemoglobin 13.5 11.1 - 15.9 g/dL   Hematocrit 25.6 38.9 - 46.6 %   MCV 87 79 - 97 fL   MCH 28.5 26.6 - 33.0 pg   MCHC 32.8 31.5 - 35.7 g/dL   RDW 37.3 42.8 - 76.8 %   Platelets 271 150 - 450 x10E3/uL   Neutrophils 58 Not Estab. %   Lymphs 33 Not Estab. %   Monocytes 7 Not Estab. %   Eos 1 Not Estab. %   Basos 1 Not Estab. %   Neutrophils Absolute 3.7 1.4 - 7.0 x10E3/uL   Lymphocytes Absolute 2.0 0.7 - 3.1 x10E3/uL   Monocytes Absolute 0.4 0.1 - 0.9 x10E3/uL   EOS (ABSOLUTE) 0.1 0.0 - 0.4 x10E3/uL   Basophils Absolute 0.0 0.0 - 0.2 x10E3/uL   Immature Granulocytes 0 Not Estab. %   Immature Grans (Abs) 0.0 0.0 - 0.1 x10E3/uL  Comprehensive metabolic panel  Result Value Ref Range   Glucose 83 65 - 99 mg/dL   BUN 9 6 - 20 mg/dL   Creatinine, Ser 1.15 0.57 -  1.00 mg/dL   GFR calc non Af Amer 96 >59 mL/min/1.73   GFR calc Af Amer 110 >59 mL/min/1.73   BUN/Creatinine Ratio 11 9 - 23   Sodium 140 134 -  144 mmol/L   Potassium 3.9 3.5 - 5.2 mmol/L   Chloride 100 96 - 106 mmol/L   CO2 23 20 - 29 mmol/L   Calcium 9.6 8.7 - 10.2 mg/dL   Total Protein 6.9 6.0 - 8.5 g/dL   Albumin 4.3 3.8 - 4.8 g/dL   Globulin, Total 2.6 1.5 - 4.5 g/dL   Albumin/Globulin Ratio 1.7 1.2 - 2.2   Bilirubin Total <0.2 0.0 - 1.2 mg/dL   Alkaline Phosphatase 75 44 - 121 IU/L   AST 15 0 - 40 IU/L   ALT 12 0 - 32 IU/L  Lipid Panel w/o Chol/HDL Ratio  Result Value Ref Range   Cholesterol, Total 199 100 - 199 mg/dL   Triglycerides 960 0 - 149 mg/dL   HDL 48 >45 mg/dL   VLDL Cholesterol Cal 24 5 - 40 mg/dL   LDL Chol Calc (NIH) 409 (H) 0 - 99 mg/dL  Urinalysis, Routine w reflex microscopic  Result Value Ref Range   Specific Gravity, UA 1.020 1.005 - 1.030   pH, UA 6.0 5.0 - 7.5   Color, UA Yellow Yellow   Appearance Ur Clear Clear   Leukocytes,UA Negative Negative   Protein,UA Negative Negative/Trace   Glucose, UA Negative Negative   Ketones, UA Negative Negative   RBC, UA Negative Negative   Bilirubin, UA Negative Negative   Urobilinogen, Ur 0.2 0.2 - 1.0 mg/dL   Nitrite, UA Negative Negative  TSH  Result Value Ref Range   TSH 1.470 0.450 - 4.500 uIU/mL  Cytology - PAP  Result Value Ref Range   High risk HPV Negative    Adequacy      Satisfactory for evaluation; transformation zone component ABSENT.   Diagnosis      - Negative for intraepithelial lesion or malignancy (NILM)   Comment Normal Reference Range HPV - Negative       Assessment & Plan:   Problem List Items Addressed This Visit       Cardiovascular and Mediastinum   Migraine    Under good control on current regimen. Continue current regimen. Continue to monitor. Call with any concerns. Refills given.        Relevant Medications   diclofenac (VOLTAREN) 75 MG EC tablet     Respiratory    Asthma, moderate persistent - Primary    Under good control on current regimen. Continue current regimen. Continue to monitor. Call with any concerns. Refills given.          Other   Acute anxiety    Under good control on current regimen. Continue current regimen. Continue to monitor. Call with any concerns. Refills up to date. Continue to monitor.        Other Visit Diagnoses     RUQ pain       Likely msk. Will use heat and monitor. If not improving- let us know.    Vitamin D deficiency       Labs drawn today. Await results.   Relevant Orders   VITAMIN D 25 Hydroxy (Vit-D Deficiency, Fractures)        Follow up plan: Return in about 6 months (around 09/03/2021) for physical.

## 2021-03-04 NOTE — Assessment & Plan Note (Signed)
Under good control on current regimen. Continue current regimen. Continue to monitor. Call with any concerns. Refills up to date. Continue to monitor.   

## 2021-03-04 NOTE — Assessment & Plan Note (Signed)
Under good control on current regimen. Continue current regimen. Continue to monitor. Call with any concerns. Refills given.   

## 2021-03-05 ENCOUNTER — Encounter: Payer: Self-pay | Admitting: Family Medicine

## 2021-03-05 LAB — VITAMIN D 25 HYDROXY (VIT D DEFICIENCY, FRACTURES): Vit D, 25-Hydroxy: 28.2 ng/mL — ABNORMAL LOW (ref 30.0–100.0)

## 2021-03-12 ENCOUNTER — Other Ambulatory Visit: Payer: Self-pay | Admitting: Family Medicine

## 2021-03-12 MED ORDER — VITAMIN D (ERGOCALCIFEROL) 1.25 MG (50000 UNIT) PO CAPS: 50000.0000 [IU] | ORAL_CAPSULE | ORAL | 0 refills | Status: DC

## 2021-04-25 ENCOUNTER — Other Ambulatory Visit: Payer: Self-pay

## 2021-04-25 MED ORDER — BUSPIRONE HCL 5 MG PO TABS: ORAL_TABLET | ORAL | 0 refills | Status: DC

## 2021-06-03 ENCOUNTER — Encounter: Payer: Self-pay | Admitting: Family Medicine

## 2021-06-10 ENCOUNTER — Ambulatory Visit: Payer: No Typology Code available for payment source | Admitting: Family Medicine

## 2021-06-10 ENCOUNTER — Encounter: Payer: Self-pay | Admitting: Family Medicine

## 2021-06-10 ENCOUNTER — Other Ambulatory Visit: Payer: Self-pay

## 2021-06-10 VITALS — BP 113/77 | HR 84 | Temp 98.2°F | Ht 68.74 in | Wt 245.8 lb

## 2021-06-10 DIAGNOSIS — R5383 Other fatigue: Secondary | ICD-10-CM

## 2021-06-10 DIAGNOSIS — G478 Other sleep disorders: Secondary | ICD-10-CM

## 2021-06-10 NOTE — Patient Instructions (Signed)
Overview Rapid eye movement (REM) sleep behavior disorder is a sleep disorder in which you physically act out vivid, often unpleasant dreams with vocal sounds and sudden, often violent arm and leg movements during REM sleep -- sometimes called dream-enacting behavior. You normally don't move during REM sleep, a normal stage of sleep that occurs many times during the night. About 20 percent of your sleep is spent in REM sleep, the usual time for dreaming, which occurs primarily during the second half of the night. The onset of REM sleep behavior disorder is often gradual and it can get worse with time. REM sleep behavior disorder may be associated with other neurological conditions, such as Lewy body dementia (also called dementia with Lewy bodies), Parkinson's disease or multiple system atrophy. Symptoms With REM sleep behavior disorder, instead of experiencing the normal temporary paralysis of your arms and legs (atonia) during REM sleep, you physically act out your dreams. The onset can be gradual or sudden, and episodes may occur occasionally or several times a night. The disorder often worsens with time. Symptoms of REM sleep behavior disorder may include: Movement, such as kicking, punching, arm flailing or jumping from bed, in response to action-filled or violent dreams, such as being chased or defending yourself from an attack Noises, such as talking, laughing, shouting, emotional outcries or even cursing Being able to recall the dream if you awaken during the episode When to see a doctor If you have any of the symptoms above or are experiencing other problems sleeping, talk to your doctor. Causes Nerve pathways in the brain that prevent muscles from moving are active during normal REM or dreaming sleep, resulting in temporary paralysis of your body. In REM sleep behavior disorder, these pathways no longer work and you may physically act out your dreams. Risk factors Factors associated with  the development of REM sleep behavior disorder include: Being female and over 70 years old -- however, more women are now being diagnosed with the disorder, especially under age 53, and young adults and children can develop the disorder, usually in association with narcolepsy, antidepressant use or brain tumors Having a certain type of neurodegenerative disorder, such as Parkinson's disease, multiple system atrophy, stroke or dementia with Lewy bodies Having narcolepsy, a chronic sleep disorder characterized by overwhelming daytime drowsiness Taking certain medications, especially newer antidepressants, or the use or withdrawal of drugs or alcohol Recent evidence suggests that there may also be several specific environmental or personal risk factors for REM sleep behavior disorder, including occupational pesticide exposure, farming, smoking or a previous head injury. Complications Complications caused by REM sleep behavior disorder may include: Distress to your sleeping partner or other people living in your home Social isolation for fear that others may become aware of your sleep disruption Injury to yourself or your sleeping partner Diagnosis To diagnose REM sleep behavior disorder, your doctor reviews your medical history and your symptoms. Your evaluation may include: Physical and neurological exam. Your doctor conducts a physical and neurological exam and evaluates you for REM sleep behavior disorder and other sleep disorders. REM sleep behavior disorder may have symptoms similar to other sleep disorders, or it may coexist with other sleep disorders such as obstructive sleep apnea or narcolepsy. Talking with your sleeping partner. Your doctor may ask your sleeping partner whether he or she has ever seen you appear to act out your dreams while sleeping, such as punching, flailing your arms in the air, shouting or screaming. Your doctor may also ask your partner  to fill out a questionnaire about your  sleep behaviors. Nocturnal sleep study (polysomnogram). Doctors may recommend an overnight study in a sleep lab. During this test, sensors monitor your heart, lung and brain activity, breathing patterns, arm and leg movements, vocalizations, and blood oxygen levels while you sleep. Typically, you'll be videotaped to document your behavior during REM sleep cycles. Diagnostic criteria To diagnose REM sleep behavior disorder, sleep medicine physicians typically use the symptom criteria in the International Classification of Sleep Disorders, Third Edition (ICSD-3). For a diagnosis of REM sleep behavior disorder, criteria include the following: You have repeated times of arousal during sleep where you talk, make noises or perform complex motor behaviors, such as punching, kicking or running movements that often relate to the content of your dreams You recall dreams associated with these movements or sounds If you awaken during the episode, you are alert and not confused or disoriented A sleep study (polysomnogram) shows you have increased muscle activity during REM sleep Your sleep disturbance is not caused by another sleep disturbance, a mental health disorder, medication or substance abuse REM sleep behavior disorder can be the first indication of development of a neurodegenerative disease, such as Parkinson's disease, multiple system atrophy or dementia with Lewy bodies. So if you develop REM sleep behavior disorder, it's important to follow up with your doctor. Treatment Treatment for REM sleep behavior disorder may include physical safeguards and medications. Physical safeguards Your doctor may recommend that you make changes in your sleep environment to make it safer for you and your bed partner, including: Padding the floor near the bed Removing dangerous objects from the bedroom, such as sharp items and weapons Placing barriers on the side of the bed Moving furniture and clutter away from the  bed Protecting bedroom windows Possibly sleeping in a separate bed or room from your bed partner until symptoms are controlled Medications Examples of treatment options for REM sleep behavior disorder include: Melatonin. Your doctor may prescribe a dietary supplement called melatonin, which may help reduce or eliminate your symptoms. Melatonin may be as effective as clonazepam and is usually well-tolerated with few side effects. Clonazepam (Klonopin). This prescription medication, often used to treat anxiety, is also the traditional choice for treating REM sleep behavior disorder, appearing to effectively reduce symptoms. Clonazepam may cause side effects such as daytime sleepiness, decreased balance and worsening of sleep apnea. Doctors continue to study several other medications that may treat REM sleep behavior disorder. Talk with your doctor to determine the most appropriate treatment option for you. Preparing for your appointment You may start out by seeing your primary care doctor. Your doctor may refer you to a sleep specialist. Consider bringing your sleeping partner, a family member or friend along, if possible. Someone who accompanies you can help you remember what the doctor says or provide additional information. Here's some information to help you get ready for your appointment. What you can do Keeping a sleep diary for two weeks before your appointment can help your doctor understand what's happening. In the morning, record as much as you know of your (or your partner's) sleep issues that occurred the previous night. Before your appointment, make a list of: All medications, vitamins, herbs or other supplements you're taking, as well as dosages and any recent changes Any symptoms you're experiencing, including any that may seem unrelated to the reason for the appointment Key personal information, including any major stresses or recent life changes Questions to ask your doctor to make  the  most of your time together Some questions to ask your doctor may include: What's likely causing my symptoms or condition? What are other possible causes? What kinds of tests do I need? Is my condition likely temporary or long term? What's the best course of action? What are the alternatives to the primary approach you're suggesting? Should I see a specialist? Are there any brochures or other printed material that I can have? What websites do you recommend? Don't hesitate to ask other questions during your appointment. What to expect from your doctor Your doctor is likely to ask you a number of questions. Be ready to answer them to reserve time to go over any points you want to spend more time on. Your doctor may ask: When did you begin experiencing symptoms? If you have a sleeping partner, what sleep behavior has he or she observed? Have you or your sleeping partner ever been injured by your sleep behaviors? In addition to your dream-enacting behaviors, have you ever experienced sleepwalking? Are you having any motor symptoms, such as handwriting problems, tremors, unsteadiness when walking or dizziness when standing up? Are you having any memory problems? Have you had sleep problems in the past? Does anyone else in your family have sleep problems? What medications are you taking? Do you have breathing issues during sleep, such as loud, disruptive snoring or witnessed breathing pauses?

## 2021-06-10 NOTE — Progress Notes (Signed)
BP 113/77   Pulse 84   Temp 98.2 F (36.8 C) (Oral)   Ht 5' 8.74" (1.746 m)   Wt 245 lb 12.8 oz (111.5 kg)   SpO2 98%   BMI 36.57 kg/m    Subjective:    Patient ID: Miranda Mcdonald, female    DOB: October 23, 1990, 30 y.o.   MRN: [id number]  HPI: Miranda Mcdonald is a 30 y.o. female  Chief Complaint  Patient presents with   Labwork    Patient would like to have her B-12, and Celiac levels checked.    Recently diagnosed with PTSD. Her GI and psychologist would like to have some blood work done.   FATIGUE Duration:  chronic Severity: moderate  Onset: gradual Context when symptoms started:  unknown Symptoms improve with rest: no  Depressive symptoms: yes Stress/anxiety: yes Insomnia: no  Snoring: no Observed apnea by bed partner: no Daytime hypersomnolence:no Wakes feeling refreshed: no History of sleep study: no Dysnea on exertion:  no Orthopnea/PND: no Chest pain: no Chronic cough: no Lower extremity edema: no Arthralgias:no Myalgias: no Weakness: no Rash: no  Relevant past medical, surgical, family and social history reviewed and updated as indicated. Interim medical history since our last visit reviewed. Allergies and medications reviewed and updated.  Review of Systems  Constitutional: Negative.   HENT: Negative.  Negative for congestion, dental problem, drooling, ear discharge, ear pain, facial swelling, hearing loss, mouth sores, nosebleeds, postnasal drip, rhinorrhea, sinus pressure, sinus pain, sneezing, sore throat, tinnitus, trouble swallowing and voice change.   Respiratory: Negative.    Cardiovascular: Negative.   Gastrointestinal:  Negative for abdominal distention, abdominal pain, anal bleeding, blood in stool, constipation, diarrhea, nausea, rectal pain and vomiting.  Musculoskeletal: Negative.   Psychiatric/Behavioral: Negative.     Per HPI unless specifically indicated above     Objective:    BP 113/77   Pulse 84   Temp 98.2 F (36.8 C) (Oral)    Ht 5' 8.74" (1.746 m)   Wt 245 lb 12.8 oz (111.5 kg)   SpO2 98%   BMI 36.57 kg/m   Wt Readings from Last 3 Encounters:  06/10/21 245 lb 12.8 oz (111.5 kg)  03/04/21 242 lb (109.8 kg)  08/30/20 236 lb 3.2 oz (107.1 kg)    Physical Exam Vitals and nursing note reviewed.  Constitutional:      General: She is not in acute distress.    Appearance: Normal appearance. She is not ill-appearing, toxic-appearing or diaphoretic.  HENT:     Head: Normocephalic and atraumatic.     Right Ear: External ear normal.     Left Ear: External ear normal.     Nose: Nose normal.     Mouth/Throat:     Mouth: Mucous membranes are moist.     Pharynx: Oropharynx is clear.  Eyes:     General: No scleral icterus.       Right eye: No discharge.        Left eye: No discharge.     Extraocular Movements: Extraocular movements intact.     Conjunctiva/sclera: Conjunctivae normal.     Pupils: Pupils are equal, round, and reactive to light.  Cardiovascular:     Rate and Rhythm: Normal rate and regular rhythm.     Pulses: Normal pulses.     Heart sounds: Normal heart sounds. No murmur heard.   No friction rub. No gallop.  Pulmonary:     Effort: Pulmonary effort is normal. No respiratory distress.  Breath sounds: Normal breath sounds. No stridor. No wheezing, rhonchi or rales.  Chest:     Chest wall: No tenderness.  Musculoskeletal:        General: Normal range of motion.     Cervical back: Normal range of motion and neck supple.  Skin:    General: Skin is warm and dry.     Capillary Refill: Capillary refill takes less than 2 seconds.     Coloration: Skin is not jaundiced or pale.     Findings: No bruising, erythema, lesion or rash.  Neurological:     General: No focal deficit present.     Mental Status: She is alert and oriented to person, place, and time. Mental status is at baseline.  Psychiatric:        Mood and Affect: Mood normal.        Behavior: Behavior normal.        Thought Content:  Thought content normal.        Judgment: Judgment normal.    Results for orders placed or performed in visit on 03/04/21  VITAMIN D 25 Hydroxy (Vit-D Deficiency, Fractures)  Result Value Ref Range   Vit D, 25-Hydroxy 28.2 (L) 30.0 - 100.0 ng/mL      Assessment & Plan:   Problem List Items Addressed This Visit   None Visit Diagnoses     Other fatigue    -  Primary   WIll check labs today. Await results. Treat as needed.    Relevant Orders   Gliadin antibodies, serum   Tissue transglutaminase, IgA   Reticulin Antibody, IgA w reflex titer   B12   Vitamin D (25 hydroxy)   TSH   Comprehensive metabolic panel   CBC with Differential/Platelet   PTH, intact (no Ca)   Sleep paralysis       Discussed pathophysiology. Declines trazodone at this time. Continue to monitor. Call with any concerns.         Follow up plan: Return if symptoms worsen or fail to improve.

## 2021-06-12 LAB — CBC WITH DIFFERENTIAL/PLATELET
Basophils Absolute: 0.1 10*3/uL (ref 0.0–0.2)
Basos: 1 %
EOS (ABSOLUTE): 0.1 10*3/uL (ref 0.0–0.4)
Eos: 2 %
Hematocrit: 39.6 % (ref 34.0–46.6)
Hemoglobin: 13.1 g/dL (ref 11.1–15.9)
Immature Grans (Abs): 0 10*3/uL (ref 0.0–0.1)
Immature Granulocytes: 0 %
Lymphocytes Absolute: 2 10*3/uL (ref 0.7–3.1)
Lymphs: 27 %
MCH: 28.9 pg (ref 26.6–33.0)
MCHC: 33.1 g/dL (ref 31.5–35.7)
MCV: 87 fL (ref 79–97)
Monocytes Absolute: 0.4 10*3/uL (ref 0.1–0.9)
Monocytes: 6 %
Neutrophils Absolute: 4.7 10*3/uL (ref 1.4–7.0)
Neutrophils: 64 %
Platelets: 260 10*3/uL (ref 150–450)
RBC: 4.54 x10E6/uL (ref 3.77–5.28)
RDW: 13.1 % (ref 11.7–15.4)
WBC: 7.3 10*3/uL (ref 3.4–10.8)

## 2021-06-12 LAB — COMPREHENSIVE METABOLIC PANEL
ALT: 20 IU/L (ref 0–32)
AST: 17 IU/L (ref 0–40)
Albumin/Globulin Ratio: 2.5 — ABNORMAL HIGH (ref 1.2–2.2)
Albumin: 4.2 g/dL (ref 3.8–4.8)
Alkaline Phosphatase: 52 IU/L (ref 44–121)
BUN/Creatinine Ratio: 18 (ref 9–23)
BUN: 12 mg/dL (ref 6–20)
Bilirubin Total: 0.2 mg/dL (ref 0.0–1.2)
CO2: 23 mmol/L (ref 20–29)
Calcium: 8.9 mg/dL (ref 8.7–10.2)
Chloride: 103 mmol/L (ref 96–106)
Creatinine, Ser: 0.68 mg/dL (ref 0.57–1.00)
Globulin, Total: 1.7 g/dL (ref 1.5–4.5)
Glucose: 80 mg/dL (ref 70–99)
Potassium: 3.6 mmol/L (ref 3.5–5.2)
Sodium: 138 mmol/L (ref 134–144)
Total Protein: 5.9 g/dL — ABNORMAL LOW (ref 6.0–8.5)
eGFR: 119 mL/min/{1.73_m2} (ref >59)

## 2021-06-12 LAB — RETICULIN ANTIBODIES, IGA W TITER: Reticulin Ab, IgA: NEGATIVE titer (ref <2.5)

## 2021-06-12 LAB — TISSUE TRANSGLUTAMINASE, IGA: Transglutaminase IgA: <2 U/mL (ref 0–3)

## 2021-06-12 LAB — TSH: TSH: 2.01 u[IU]/mL (ref 0.450–4.500)

## 2021-06-12 LAB — VITAMIN D 25 HYDROXY (VIT D DEFICIENCY, FRACTURES): Vit D, 25-Hydroxy: 41.8 ng/mL (ref 30.0–100.0)

## 2021-06-12 LAB — VITAMIN B12: Vitamin B-12: 357 pg/mL (ref 232–1245)

## 2021-06-12 LAB — GLIADIN ANTIBODIES, SERUM
Antigliadin Abs, IgA: 4 units (ref 0–19)
Gliadin IgG: 1 units (ref 0–19)

## 2021-06-12 LAB — PARATHYROID HORMONE, INTACT (NO CA): PTH: 16 pg/mL (ref 15–65)

## 2021-07-12 ENCOUNTER — Ambulatory Visit: Payer: No Typology Code available for payment source | Admitting: Family Medicine

## 2021-07-12 ENCOUNTER — Encounter: Payer: Self-pay | Admitting: Family Medicine

## 2021-07-12 ENCOUNTER — Other Ambulatory Visit: Payer: Self-pay

## 2021-07-12 VITALS — BP 101/69 | HR 86 | Temp 98.0°F | Wt 239.4 lb

## 2021-07-12 DIAGNOSIS — J029 Acute pharyngitis, unspecified: Secondary | ICD-10-CM

## 2021-07-12 MED ORDER — PREDNISONE 50 MG PO TABS: 50.0000 mg | ORAL_TABLET | Freq: Every day | ORAL | 0 refills | Status: DC

## 2021-07-12 NOTE — Progress Notes (Signed)
BP 101/69    Pulse 86    Temp 98 F (36.7 C)    Wt 239 lb 6.4 oz (108.6 kg)    SpO2 96%    BMI 35.62 kg/m    Subjective:    Patient ID: Miranda Mcdonald, female    DOB: 10-20-1990, 31 y.o.   MRN: [id number]  HPI: Miranda Mcdonald is a 31 y.o. female  Chief Complaint  Patient presents with   Ear Pain    Patient states both her ears have been hurting for about a week    Sore Throat    Patient has sore throat for about 2-3 days   UPPER RESPIRATORY TRACT INFECTION Duration: 2-3 days Worst symptom: ear pain, sore throat Fever: no Cough: yes Shortness of breath: no Wheezing: yes Chest pain: no Chest tightness: yes Chest congestion: no Nasal congestion: yes Runny nose: no Post nasal drip: no Sneezing: no Sore throat: yes Swollen glands: no Sinus pressure: no Headache: yes Face pain: no Toothache: no Ear pain: yes bilateral Ear pressure: yes  Eyes red/itching:no Eye drainage/crusting: no  Vomiting: no Rash: no Fatigue: yes Sick contacts: yes Strep contacts: no  Context: worse Recurrent sinusitis: no Relief with OTC cold/cough medications: no  Treatments attempted: cold/sinus, mucinex, anti-histamine, and pseudoephedrine   Relevant past medical, surgical, family and social history reviewed and updated as indicated. Interim medical history since our last visit reviewed. Allergies and medications reviewed and updated.  Review of Systems  Constitutional:  Positive for fatigue. Negative for activity change, appetite change, chills, diaphoresis, fever and unexpected weight change.  HENT:  Positive for congestion, ear pain, postnasal drip and rhinorrhea. Negative for dental problem, drooling, ear discharge, facial swelling, hearing loss, mouth sores, nosebleeds, sinus pressure, sinus pain, sneezing, sore throat, tinnitus, trouble swallowing and voice change.   Eyes: Negative.   Respiratory:  Positive for cough and wheezing. Negative for apnea, choking, chest tightness, shortness  of breath and stridor.   Cardiovascular: Negative.   Gastrointestinal: Negative.   Musculoskeletal: Negative.   Psychiatric/Behavioral: Negative.     Per HPI unless specifically indicated above     Objective:    BP 101/69    Pulse 86    Temp 98 F (36.7 C)    Wt 239 lb 6.4 oz (108.6 kg)    SpO2 96%    BMI 35.62 kg/m   Wt Readings from Last 3 Encounters:  07/12/21 239 lb 6.4 oz (108.6 kg)  06/10/21 245 lb 12.8 oz (111.5 kg)  03/04/21 242 lb (109.8 kg)    Physical Exam Vitals and nursing note reviewed.  Constitutional:      General: She is not in acute distress.    Appearance: Normal appearance. She is normal weight. She is not ill-appearing, toxic-appearing or diaphoretic.  HENT:     Head: Normocephalic and atraumatic.     Right Ear: Tympanic membrane, ear canal and external ear normal.     Left Ear: Tympanic membrane, ear canal and external ear normal.     Nose: Congestion and rhinorrhea present.     Mouth/Throat:     Mouth: Mucous membranes are moist.     Pharynx: Oropharynx is clear.  Eyes:     General: No scleral icterus.       Right eye: No discharge.        Left eye: No discharge.     Extraocular Movements: Extraocular movements intact.     Conjunctiva/sclera: Conjunctivae normal.     Pupils: Pupils are equal,  round, and reactive to light.  Cardiovascular:     Rate and Rhythm: Normal rate and regular rhythm.     Pulses: Normal pulses.     Heart sounds: Normal heart sounds. No murmur heard.   No friction rub. No gallop.  Pulmonary:     Effort: Pulmonary effort is normal. No respiratory distress.     Breath sounds: Normal breath sounds. No stridor. No wheezing, rhonchi or rales.  Chest:     Chest wall: No tenderness.  Musculoskeletal:        General: Normal range of motion.     Cervical back: Normal range of motion and neck supple.  Skin:    General: Skin is warm and dry.     Capillary Refill: Capillary refill takes less than 2 seconds.     Coloration: Skin  is not jaundiced or pale.     Findings: No bruising, erythema, lesion or rash.  Neurological:     General: No focal deficit present.     Mental Status: She is alert and oriented to person, place, and time. Mental status is at baseline.  Psychiatric:        Mood and Affect: Mood normal.        Behavior: Behavior normal.        Thought Content: Thought content normal.        Judgment: Judgment normal.    Results for orders placed or performed in visit on 06/10/21  Gliadin antibodies, serum  Result Value Ref Range   Antigliadin Abs, IgA 4 0 - 19 units   Gliadin IgG 1 0 - 19 units  Tissue transglutaminase, IgA  Result Value Ref Range   Transglutaminase IgA <2 0 - 3 U/mL  Reticulin Antibody, IgA w reflex titer  Result Value Ref Range   Reticulin Ab, IgA Negative Neg:<1:2.5 titer  B12  Result Value Ref Range   Vitamin B-12 357 232 - 1,245 pg/mL  Vitamin D (25 hydroxy)  Result Value Ref Range   Vit D, 25-Hydroxy 41.8 30.0 - 100.0 ng/mL  TSH  Result Value Ref Range   TSH 2.010 0.450 - 4.500 uIU/mL  Comprehensive metabolic panel  Result Value Ref Range   Glucose 80 70 - 99 mg/dL   BUN 12 6 - 20 mg/dL   Creatinine, Ser 0.68 0.57 - 1.00 mg/dL   eGFR 119 >59 mL/min/1.73   BUN/Creatinine Ratio 18 9 - 23   Sodium 138 134 - 144 mmol/L   Potassium 3.6 3.5 - 5.2 mmol/L   Chloride 103 96 - 106 mmol/L   CO2 23 20 - 29 mmol/L   Calcium 8.9 8.7 - 10.2 mg/dL   Total Protein 5.9 (L) 6.0 - 8.5 g/dL   Albumin 4.2 3.8 - 4.8 g/dL   Globulin, Total 1.7 1.5 - 4.5 g/dL   Albumin/Globulin Ratio 2.5 (H) 1.2 - 2.2   Bilirubin Total 0.2 0.0 - 1.2 mg/dL   Alkaline Phosphatase 52 44 - 121 IU/L   AST 17 0 - 40 IU/L   ALT 20 0 - 32 IU/L  CBC with Differential/Platelet  Result Value Ref Range   WBC 7.3 3.4 - 10.8 x10E3/uL   RBC 4.54 3.77 - 5.28 x10E6/uL   Hemoglobin 13.1 11.1 - 15.9 g/dL   Hematocrit 39.6 34.0 - 46.6 %   MCV 87 79 - 97 fL   MCH 28.9 26.6 - 33.0 pg   MCHC 33.1 31.5 - 35.7 g/dL    RDW 13.1 11.7 - 15.4 %  Platelets 260 150 - 450 x10E3/uL   Neutrophils 64 Not Estab. %   Lymphs 27 Not Estab. %   Monocytes 6 Not Estab. %   Eos 2 Not Estab. %   Basos 1 Not Estab. %   Neutrophils Absolute 4.7 1.4 - 7.0 x10E3/uL   Lymphocytes Absolute 2.0 0.7 - 3.1 x10E3/uL   Monocytes Absolute 0.4 0.1 - 0.9 x10E3/uL   EOS (ABSOLUTE) 0.1 0.0 - 0.4 x10E3/uL   Basophils Absolute 0.1 0.0 - 0.2 x10E3/uL   Immature Granulocytes 0 Not Estab. %   Immature Grans (Abs) 0.0 0.0 - 0.1 x10E3/uL  PTH, intact (no Ca)  Result Value Ref Range   PTH 16 15 - 65 pg/mL      Assessment & Plan:   Problem List Items Addressed This Visit   None Visit Diagnoses     Sore throat    -  Primary   Strep and flu negative. Await covid. Will treat wiht prednisone burst. Call if not getting better or getting worse. Continue to monitor.    Relevant Orders   Rapid Strep Screen ([facility] ONLY)   Veritor Flu A/B Waived   Novel Coronavirus, NAA ([facility])        Follow up plan: Return if symptoms worsen or fail to improve.

## 2021-07-13 LAB — SARS-COV-2, NAA 2 DAY TAT

## 2021-07-13 LAB — NOVEL CORONAVIRUS, NAA: SARS-CoV-2, NAA: NOT DETECTED

## 2021-07-15 LAB — RAPID STREP SCREEN (MED CTR MEBANE ONLY): Strep Gp A Ag, IA W/Reflex: NEGATIVE

## 2021-07-15 LAB — CULTURE, GROUP A STREP: Strep A Culture: NEGATIVE

## 2021-07-15 LAB — VERITOR FLU A/B WAIVED
Influenza A: NEGATIVE
Influenza B: NEGATIVE

## 2021-07-31 ENCOUNTER — Other Ambulatory Visit: Payer: Self-pay | Admitting: Family Medicine

## 2021-07-31 DIAGNOSIS — J4540 Moderate persistent asthma, uncomplicated: Secondary | ICD-10-CM

## 2021-07-31 NOTE — Telephone Encounter (Signed)
Requested medication (s) are due for refill today -Expired Rx  Requested medication (s) are on the active medication list -yes  Future visit scheduled -yes  Last refill: 05/09/20 18g 6 RF  Notes to clinic: Request RF: expired Rx  Requested Prescriptions  Pending Prescriptions Disp Refills   albuterol (VENTOLIN HFA) 108 (90 Base) MCG/ACT inhaler [Pharmacy Med Name: Albuterol Sulfate HFA 108 (90 Base) MCG/ACT Inhalation Aerosol Solution] 18 g 0    Sig: INHALE 2 PUFFS BY MOUTH EVERY 6 HOURS AS NEEDED FOR WHEEZING FOR SHORTNESS OF BREATH     Pulmonology:  Beta Agonists Failed - 07/31/2021 10:50 AM      Failed - One inhaler should last at least one month. If the patient is requesting refills earlier, contact the patient to check for uncontrolled symptoms.      Passed - Valid encounter within last 12 months    Recent Outpatient Visits           2 weeks ago Sore throat   [place], [place], DO   1 month ago Other fatigue   [place], [place], DO   4 months ago Moderate persistent asthma without complication   [place], Megan P, DO   11 months ago Routine general medical examination at a health care facility   [facility], [place], DO   1 year ago Suspected COVID-19 virus infection   [place], [place], DO       Future Appointments             In 1 month Johnson, Megan P, DO [place], [facility]               Requested Prescriptions  Pending Prescriptions Disp Refills   albuterol (VENTOLIN HFA) 108 (90 Base) MCG/ACT inhaler [Pharmacy Med Name: Albuterol Sulfate HFA 108 (90 Base) MCG/ACT Inhalation Aerosol Solution] 18 g 0    Sig: INHALE 2 PUFFS BY MOUTH EVERY 6 HOURS AS NEEDED FOR WHEEZING FOR SHORTNESS OF BREATH     Pulmonology:  Beta Agonists Failed - 07/31/2021 10:50 AM      Failed - One inhaler should last at least one month. If the patient is  requesting refills earlier, contact the patient to check for uncontrolled symptoms.      Passed - Valid encounter within last 12 months    Recent Outpatient Visits           2 weeks ago Sore throat   [place], [place], DO   1 month ago Other fatigue   [place], Megan P, DO   4 months ago Moderate persistent asthma without complication   [place], Megan P, DO   11 months ago Routine general medical examination at a health care facility   [place], [place] P, DO   1 year ago Suspected COVID-19 virus infection   [place], [place], DO       Future Appointments             In 1 month Johnson, Barb Mer[place], DO [employer], [facility]

## 2021-08-09 ENCOUNTER — Other Ambulatory Visit: Payer: Self-pay | Admitting: Family Medicine

## 2021-08-09 DIAGNOSIS — J4540 Moderate persistent asthma, uncomplicated: Secondary | ICD-10-CM

## 2021-08-09 NOTE — Telephone Encounter (Signed)
Requested medication (s) are due for refill today:   Not sure  Xopenex HFA 45 mcg/ACT inhaler  Requested medication (s) are on the active medication list:   No  Future visit scheduled:   Yes   Last ordered: Not on the medication list.   It was ordered a week ago by Dr. Laural Benes.   NDC not covered by insurance  Requested Prescriptions  Pending Prescriptions Disp Refills   levalbuterol (XOPENEX HFA) 45 MCG/ACT inhaler [Pharmacy Med Name: LEVALBUTEROL45/ACT  AER] 15 g 0    Sig: INHALE 2 PUFFS BY MOUTH EVERY 6 HOURS AS NEEDED FOR WHEEZING AND FOR SHORTNESS OF BREATH     Pulmonology:  Beta Agonists 2 Passed - 08/09/2021 11:12 AM      Passed - Last BP in normal range    BP Readings from Last 1 Encounters:  07/12/21 101/69          Passed - Last Heart Rate in normal range    Pulse Readings from Last 1 Encounters:  07/12/21 86          Passed - Valid encounter within last 12 months    Recent Outpatient Visits           4 weeks ago Sore throat   [facility] [place], [place], DO   2 months ago Other fatigue   [facility] [place], Megan P, DO   5 months ago Moderate persistent asthma without complication   [facility] [place], Megan P, DO   11 months ago Routine general medical examination at a health care facility   [facility] [place], [place] P, DO   1 year ago Suspected COVID-19 virus infection   [facility] [place], [place], DO       Future Appointments             In 3 weeks Laural Benes, Oralia Rud, DO [employer] [facility], [facility]

## 2021-09-04 ENCOUNTER — Ambulatory Visit (INDEPENDENT_AMBULATORY_CARE_PROVIDER_SITE_OTHER): Payer: No Typology Code available for payment source | Admitting: Family Medicine

## 2021-09-04 ENCOUNTER — Encounter: Payer: Self-pay | Admitting: Family Medicine

## 2021-09-04 ENCOUNTER — Other Ambulatory Visit: Payer: Self-pay

## 2021-09-04 VITALS — BP 105/64 | HR 91 | Temp 98.2°F | Ht 67.4 in | Wt 239.8 lb

## 2021-09-04 DIAGNOSIS — Z0000 Encounter for general adult medical examination without abnormal findings: Secondary | ICD-10-CM | POA: Diagnosis not present

## 2021-09-04 DIAGNOSIS — F419 Anxiety disorder, unspecified: Secondary | ICD-10-CM | POA: Diagnosis not present

## 2021-09-04 DIAGNOSIS — J4540 Moderate persistent asthma, uncomplicated: Secondary | ICD-10-CM

## 2021-09-04 LAB — URINALYSIS, ROUTINE W REFLEX MICROSCOPIC
Bilirubin, UA: NEGATIVE
Glucose, UA: NEGATIVE
Ketones, UA: NEGATIVE
Leukocytes,UA: NEGATIVE
Nitrite, UA: NEGATIVE
Protein,UA: NEGATIVE
RBC, UA: NEGATIVE
Specific Gravity, UA: 1.015 (ref 1.005–1.030)
Urobilinogen, Ur: 0.2 mg/dL (ref 0.2–1.0)
pH, UA: 6 (ref 5.0–7.5)

## 2021-09-04 MED ORDER — SPIRIVA RESPIMAT 2.5 MCG/ACT IN AERS: 1.0000 | INHALATION_SPRAY | Freq: Every day | RESPIRATORY_TRACT | 12 refills | Status: DC

## 2021-09-04 MED ORDER — LEVALBUTEROL TARTRATE 45 MCG/ACT IN AERO: INHALATION_SPRAY | RESPIRATORY_TRACT | 6 refills | Status: DC

## 2021-09-04 MED ORDER — BUSPIRONE HCL 5 MG PO TABS: ORAL_TABLET | ORAL | 1 refills | Status: DC

## 2021-09-04 MED ORDER — SERTRALINE HCL 100 MG PO TABS
100.0000 mg | ORAL_TABLET | Freq: Every day | ORAL | 1 refills | Status: DC
Start: 2021-09-04 — End: 2022-12-26

## 2021-09-04 NOTE — Progress Notes (Signed)
BP 105/64    Pulse 91    Temp 98.2 F (36.8 C) (Oral)    Ht 5' 7.4" (1.712 m)    Wt 239 lb 12.8 oz (108.8 kg)    LMP 08/26/2021 (Approximate)    SpO2 98%    BMI 37.11 kg/m    Subjective:    Patient ID: Miranda Mcdonald, female    DOB: 05-Oct-1990, 31 y.o.   MRN: [id number]  HPI: Miranda Mcdonald is a 31 y.o. female presenting on 09/04/2021 for comprehensive medical examination. Current medical complaints include:  ASTHMA Asthma status: stable Satisfied with current treatment?: yes Albuterol/rescue inhaler frequency: occasionally Dyspnea frequency: occasionally Wheezing frequency:occasionally Cough frequency: occasionally Nocturnal symptom frequency: never  Limitation of activity: no Current upper respiratory symptoms: no Aerochamber/spacer use: yes Visits to ER or Urgent Care in past year: no Pneumovax: Up to Date Influenza: Up to Date  ANXIETY/STRESS Duration: chronic:  Status: better Anxious mood: yes  Excessive worrying: yes Irritability: yes  Sweating: no Nausea: no Palpitations:no Hyperventilation: no Panic attacks: no Agoraphobia: no  Obscessions/compulsions: no Depressed mood: no Depression screen [facility] 2/9 09/04/2021 07/12/2021 06/10/2021 03/04/2021 05/09/2020  Decreased Interest '1 1 1 ' 0 2  Down, Depressed, Hopeless '1 1 1 1 2  ' PHQ - 2 Score '2 2 2 1 4  ' Altered sleeping '1 3 1 3 3  ' Tired, decreased energy '1 1 1 ' 0 1  Change in appetite 0 0 0 1 3  Feeling bad or failure about yourself  0 0 '2 1 2  ' Trouble concentrating '2 1 3 3 1  ' Moving slowly or fidgety/restless 1 0 0 0 1  Suicidal thoughts 0 0 0 0 1  PHQ-9 Score '7 7 9 9 16  ' Difficult doing work/chores Somewhat difficult - Somewhat difficult Not difficult at all Very difficult  Some recent data might be hidden   Anhedonia: no Weight changes: no Insomnia: no   Hypersomnia: no Fatigue/loss of energy: no Feelings of worthlessness: no Feelings of guilt: no Impaired concentration/indecisiveness: no Suicidal ideations: no   Crying spells: no Recent Stressors/Life Changes: no   Relationship problems: no   Family stress: no     Financial stress: no    Job stress: no    Recent death/loss: no  She currently lives with: husband and daugher Menopausal Symptoms: no  Depression Screen done today and results listed below:  Depression screen [facility] 2/9 09/04/2021 07/12/2021 06/10/2021 03/04/2021 05/09/2020  Decreased Interest '1 1 1 ' 0 2  Down, Depressed, Hopeless '1 1 1 1 2  ' PHQ - 2 Score '2 2 2 1 4  ' Altered sleeping '1 3 1 3 3  ' Tired, decreased energy '1 1 1 ' 0 1  Change in appetite 0 0 0 1 3  Feeling bad or failure about yourself  0 0 '2 1 2  ' Trouble concentrating '2 1 3 3 1  ' Moving slowly or fidgety/restless 1 0 0 0 1  Suicidal thoughts 0 0 0 0 1  PHQ-9 Score '7 7 9 9 16  ' Difficult doing work/chores Somewhat difficult - Somewhat difficult Not difficult at all Very difficult  Some recent data might be hidden    Past Medical History:  Past Medical History:  Diagnosis Date   Asthma    Broken arm    Closed fracture distal radius and ulna    Closed right radial fracture    Heart palpitations 03/19/2015   Hypoglycemia    Lactose intolerance in adult    Migraines    Post  traumatic stress disorder (PTSD)     Surgical History:  Past Surgical History:  Procedure Laterality Date   CLAVICLE SURGERY Left 2007   CLAVICLE SURGERY     surgery to remove pin from first surgery   KNEE SURGERY Left    WISDOM TOOTH EXTRACTION      Medications:  Current Outpatient Medications on File Prior to Visit  Medication Sig   diclofenac sodium (VOLTAREN) 1 % GEL Apply 4 g topically 4 (four) times daily.   fluticasone-salmeterol (ADVAIR) 100-50 MCG/ACT AEPB Inhale 1 puff into the lungs 2 (two) times daily.   ketorolac (TORADOL) 10 MG tablet Take 1 tablet (10 mg total) by mouth as needed. Max 3 per day   Multiple Vitamin (MULTIVITAMIN) tablet Take 1 tablet by mouth daily.   promethazine (PHENERGAN) 25 MG tablet Take 1 tablet (25 mg  total) by mouth every 8 (eight) hours as needed for nausea or vomiting.   SUMAtriptan (IMITREX) 100 MG tablet Take 1 tablet (100 mg total) by mouth every 2 (two) hours as needed for migraine. May repeat in 2 hours if headache persists or recurs. May 2 in 24 hours   SUMAtriptan 6 MG/0.5ML SOAJ Inject 6 mg into the skin daily as needed. For migraine   VITAMIN D PO Take 1 tablet by mouth daily.   No current facility-administered medications on file prior to visit.    Allergies:  Allergies  Allergen Reactions   Meloxicam Shortness Of Breath and Other (See Comments)    Difficulty breathing    Morphine And Related Other (See Comments)    Mean, angry, violent    Percocet [Oxycodone-Acetaminophen] Other (See Comments)    Migraine    Social History:  Social History   Socioeconomic History   Marital status: Married    Spouse name: Not on file   Number of children: Not on file   Years of education: Not on file   Highest education level: Not on file  Occupational History   Not on file  Tobacco Use   Smoking status: Never   Smokeless tobacco: Never  Vaping Use   Vaping Use: Never used  Substance and Sexual Activity   Alcohol use: Yes    Comment: occasional- pt states once in a while   Drug use: No   Sexual activity: Yes    Birth control/protection: None  Other Topics Concern   Not on file  Social History Narrative   Not on file   Social Determinants of Health   Financial Resource Strain: Not on file  Food Insecurity: Not on file  Transportation Needs: Not on file  Physical Activity: Not on file  Stress: Not on file  Social Connections: Not on file  Intimate Partner Violence: Not on file   Social History   Tobacco Use  Smoking Status Never  Smokeless Tobacco Never   Social History   Substance and Sexual Activity  Alcohol Use Yes   Comment: occasional- pt states once in a while    Family History:  Family History  Problem Relation Age of Onset   Bipolar  disorder Mother    Hypothyroidism Mother    Restless legs syndrome Mother    Neuropathy Father    Depression Father    Bipolar disorder Sister    Hypothyroidism Sister    Bipolar disorder Brother    Heart murmur Daughter    Speech disorder Daughter    Arthritis Maternal Grandmother    Hypotension Maternal Grandmother    Diabetes Maternal  Grandfather    Depression Maternal Grandfather    Hyperlipidemia Maternal Grandfather    Liver disease Maternal Grandfather        fatty liver   Bipolar disorder Maternal Grandfather    Hypothyroidism Maternal Grandfather    Hypertension Paternal Grandmother    Depression Paternal Grandmother    Depression Paternal Grandfather    Breast cancer Maternal Aunt    Cancer Maternal Aunt        breast   Breast cancer Other    Breast cancer Other     Past medical history, surgical history, medications, allergies, family history and social history reviewed with patient today and changes made to appropriate areas of the chart.   Review of Systems  Constitutional: Negative.   HENT: Negative.    Eyes: Negative.   Respiratory: Negative.    Cardiovascular:  Positive for leg swelling. Negative for chest pain, palpitations, orthopnea, claudication and PND.  Gastrointestinal: Negative.   Genitourinary: Negative.   Musculoskeletal: Negative.   Skin: Negative.   Neurological: Negative.   Endo/Heme/Allergies: Negative.   Psychiatric/Behavioral:  Positive for depression. Negative for hallucinations, memory loss, substance abuse and suicidal ideas. The patient is nervous/anxious. The patient does not have insomnia.   All other ROS negative except what is listed above and in the HPI.      Objective:    BP 105/64    Pulse 91    Temp 98.2 F (36.8 C) (Oral)    Ht 5' 7.4" (1.712 m)    Wt 239 lb 12.8 oz (108.8 kg)    LMP 08/26/2021 (Approximate)    SpO2 98%    BMI 37.11 kg/m   Wt Readings from Last 3 Encounters:  09/04/21 239 lb 12.8 oz (108.8 kg)   07/12/21 239 lb 6.4 oz (108.6 kg)  06/10/21 245 lb 12.8 oz (111.5 kg)    Physical Exam  Results for orders placed or performed in visit on 09/04/21  CBC with Differential/Platelet  Result Value Ref Range   WBC 6.5 3.4 - 10.8 x10E3/uL   RBC 4.87 3.77 - 5.28 x10E6/uL   Hemoglobin 13.7 11.1 - 15.9 g/dL   Hematocrit 42.3 34.0 - 46.6 %   MCV 87 79 - 97 fL   MCH 28.1 26.6 - 33.0 pg   MCHC 32.4 31.5 - 35.7 g/dL   RDW 12.4 11.7 - 15.4 %   Platelets 306 150 - 450 x10E3/uL   Neutrophils 56 Not Estab. %   Lymphs 33 Not Estab. %   Monocytes 7 Not Estab. %   Eos 2 Not Estab. %   Basos 1 Not Estab. %   Neutrophils Absolute 3.7 1.4 - 7.0 x10E3/uL   Lymphocytes Absolute 2.2 0.7 - 3.1 x10E3/uL   Monocytes Absolute 0.4 0.1 - 0.9 x10E3/uL   EOS (ABSOLUTE) 0.1 0.0 - 0.4 x10E3/uL   Basophils Absolute 0.0 0.0 - 0.2 x10E3/uL   Immature Granulocytes 1 Not Estab. %   Immature Grans (Abs) 0.0 0.0 - 0.1 x10E3/uL  Comprehensive metabolic panel  Result Value Ref Range   Glucose 74 70 - 99 mg/dL   BUN 14 6 - 20 mg/dL   Creatinine, Ser 0.74 0.57 - 1.00 mg/dL   eGFR 110 >59 mL/min/1.73   BUN/Creatinine Ratio 19 9 - 23   Sodium 139 134 - 144 mmol/L   Potassium 4.1 3.5 - 5.2 mmol/L   Chloride 101 96 - 106 mmol/L   CO2 25 20 - 29 mmol/L   Calcium 9.6 8.7 - 10.2 mg/dL  Total Protein 6.5 6.0 - 8.5 g/dL   Albumin 4.3 3.8 - 4.8 g/dL   Globulin, Total 2.2 1.5 - 4.5 g/dL   Albumin/Globulin Ratio 2.0 1.2 - 2.2   Bilirubin Total <0.2 0.0 - 1.2 mg/dL   Alkaline Phosphatase 65 44 - 121 IU/L   AST 15 0 - 40 IU/L   ALT 17 0 - 32 IU/L  Lipid Panel w/o Chol/HDL Ratio  Result Value Ref Range   Cholesterol, Total 205 (H) 100 - 199 mg/dL   Triglycerides 195 (H) 0 - 149 mg/dL   HDL 35 (L) >39 mg/dL   VLDL Cholesterol Cal 35 5 - 40 mg/dL   LDL Chol Calc (NIH) 135 (H) 0 - 99 mg/dL  Urinalysis, Routine w reflex microscopic  Result Value Ref Range   Specific Gravity, UA 1.015 1.005 - 1.030   pH, UA 6.0 5.0 -  7.5   Color, UA Yellow Yellow   Appearance Ur Clear Clear   Leukocytes,UA Negative Negative   Protein,UA Negative Negative/Trace   Glucose, UA Negative Negative   Ketones, UA Negative Negative   RBC, UA Negative Negative   Bilirubin, UA Negative Negative   Urobilinogen, Ur 0.2 0.2 - 1.0 mg/dL   Nitrite, UA Negative Negative  TSH  Result Value Ref Range   TSH 1.700 0.450 - 4.500 uIU/mL  Hepatitis C Antibody  Result Value Ref Range   Hep C Virus Ab Non Reactive Non Reactive  VITAMIN D 25 Hydroxy (Vit-D Deficiency, Fractures)  Result Value Ref Range   Vit D, 25-Hydroxy 55.2 30.0 - 100.0 ng/mL      Assessment & Plan:   Problem List Items Addressed This Visit       Respiratory   Asthma, moderate persistent    Under good control on current regimen. Continue current regimen. Continue to monitor. Call with any concerns. Refills given. Labs drawn today.        Relevant Medications   levalbuterol (XOPENEX HFA) 45 MCG/ACT inhaler   Tiotropium Bromide Monohydrate (SPIRIVA RESPIMAT) 2.5 MCG/ACT AERS     Other   Acute anxiety    Under good control on current regimen. Continue current regimen. Continue to monitor. Call with any concerns. Refills given. Labs drawn today.        Relevant Medications   sertraline (ZOLOFT) 100 MG tablet   busPIRone (BUSPAR) 5 MG tablet   Other Visit Diagnoses     Routine general medical examination at a health care facility    -  Primary   Vaccines up to date. Screening labs checked today. Pap up to date. Continue diet and exercise. Call with any concerns.   Relevant Orders   CBC with Differential/Platelet (Completed)   Comprehensive metabolic panel (Completed)   Lipid Panel w/o Chol/HDL Ratio (Completed)   Urinalysis, Routine w reflex microscopic (Completed)   TSH (Completed)   Hepatitis C Antibody (Completed)   VITAMIN D 25 Hydroxy (Vit-D Deficiency, Fractures) (Completed)        Follow up plan: Return in about 6 months (around  03/07/2022).   LABORATORY TESTING:  - Pap smear: up to date  IMMUNIZATIONS:   - Tdap: Tetanus vaccination status reviewed: last tetanus booster within 10 years. - Influenza: Up to date - Pneumovax: Up to date - Prevnar: Not applicable - COVID: Up to date   PATIENT COUNSELING:   Advised to take 1 mg of folate supplement per day if capable of pregnancy.   Sexuality: Discussed sexually transmitted diseases, partner selection, use of  condoms, avoidance of unintended pregnancy  and contraceptive alternatives.   Advised to avoid cigarette smoking.  I discussed with the patient that most people either abstain from alcohol or drink within safe limits (<=14/week and <=4 drinks/occasion for males, <=7/weeks and <= 3 drinks/occasion for females) and that the risk for alcohol disorders and other health effects rises proportionally with the number of drinks per week and how often a drinker exceeds daily limits.  Discussed cessation/primary prevention of drug use and availability of treatment for abuse.   Diet: Encouraged to adjust caloric intake to maintain  or achieve ideal body weight, to reduce intake of dietary saturated fat and total fat, to limit sodium intake by avoiding high sodium foods and not adding table salt, and to maintain adequate dietary potassium and calcium preferably from fresh fruits, vegetables, and low-fat dairy products.    stressed the importance of regular exercise  Injury prevention: Discussed safety belts, safety helmets, smoke detector, smoking near bedding or upholstery.   Dental health: Discussed importance of regular tooth brushing, flossing, and dental visits.    NEXT PREVENTATIVE PHYSICAL DUE IN 1 YEAR. Return in about 6 months (around 03/07/2022).

## 2021-09-05 LAB — COMPREHENSIVE METABOLIC PANEL
ALT: 17 IU/L (ref 0–32)
AST: 15 IU/L (ref 0–40)
Albumin/Globulin Ratio: 2 (ref 1.2–2.2)
Albumin: 4.3 g/dL (ref 3.8–4.8)
Alkaline Phosphatase: 65 IU/L (ref 44–121)
BUN/Creatinine Ratio: 19 (ref 9–23)
BUN: 14 mg/dL (ref 6–20)
Bilirubin Total: <0.2 mg/dL (ref 0.0–1.2)
CO2: 25 mmol/L (ref 20–29)
Calcium: 9.6 mg/dL (ref 8.7–10.2)
Chloride: 101 mmol/L (ref 96–106)
Creatinine, Ser: 0.74 mg/dL (ref 0.57–1.00)
Globulin, Total: 2.2 g/dL (ref 1.5–4.5)
Glucose: 74 mg/dL (ref 70–99)
Potassium: 4.1 mmol/L (ref 3.5–5.2)
Sodium: 139 mmol/L (ref 134–144)
Total Protein: 6.5 g/dL (ref 6.0–8.5)
eGFR: 110 mL/min/{1.73_m2} (ref >59)

## 2021-09-05 LAB — HEPATITIS C ANTIBODY: Hep C Virus Ab: NONREACTIVE

## 2021-09-05 LAB — CBC WITH DIFFERENTIAL/PLATELET
Basophils Absolute: 0 10*3/uL (ref 0.0–0.2)
Basos: 1 %
EOS (ABSOLUTE): 0.1 10*3/uL (ref 0.0–0.4)
Eos: 2 %
Hematocrit: 42.3 % (ref 34.0–46.6)
Hemoglobin: 13.7 g/dL (ref 11.1–15.9)
Immature Grans (Abs): 0 10*3/uL (ref 0.0–0.1)
Immature Granulocytes: 1 %
Lymphocytes Absolute: 2.2 10*3/uL (ref 0.7–3.1)
Lymphs: 33 %
MCH: 28.1 pg (ref 26.6–33.0)
MCHC: 32.4 g/dL (ref 31.5–35.7)
MCV: 87 fL (ref 79–97)
Monocytes Absolute: 0.4 10*3/uL (ref 0.1–0.9)
Monocytes: 7 %
Neutrophils Absolute: 3.7 10*3/uL (ref 1.4–7.0)
Neutrophils: 56 %
Platelets: 306 10*3/uL (ref 150–450)
RBC: 4.87 x10E6/uL (ref 3.77–5.28)
RDW: 12.4 % (ref 11.7–15.4)
WBC: 6.5 10*3/uL (ref 3.4–10.8)

## 2021-09-05 LAB — LIPID PANEL W/O CHOL/HDL RATIO
Cholesterol, Total: 205 mg/dL — ABNORMAL HIGH (ref 100–199)
HDL: 35 mg/dL — ABNORMAL LOW (ref >39)
LDL Chol Calc (NIH): 135 mg/dL — ABNORMAL HIGH (ref 0–99)
Triglycerides: 195 mg/dL — ABNORMAL HIGH (ref 0–149)
VLDL Cholesterol Cal: 35 mg/dL (ref 5–40)

## 2021-09-05 LAB — TSH: TSH: 1.7 u[IU]/mL (ref 0.450–4.500)

## 2021-09-05 LAB — VITAMIN D 25 HYDROXY (VIT D DEFICIENCY, FRACTURES): Vit D, 25-Hydroxy: 55.2 ng/mL (ref 30.0–100.0)

## 2021-09-08 NOTE — Assessment & Plan Note (Signed)
Under good control on current regimen. Continue current regimen. Continue to monitor. Call with any concerns. Refills given. Labs drawn today.   

## 2021-10-14 ENCOUNTER — Other Ambulatory Visit: Payer: Self-pay | Admitting: Family Medicine

## 2021-10-15 NOTE — Telephone Encounter (Signed)
Requested medication (s) are due for refill today: yes ? ?Requested medication (s) are on the active medication list: yes ? ?Last refill:  09/04/21 ? ?Future visit scheduled: yes ? ?Notes to clinic:  Unable to refill per protocol, Rx is being requested by a different pharmacy. Called pt to verify, but no answer, unable to leave VM. ? ? ?  ?Requested Prescriptions  ?Pending Prescriptions Disp Refills  ? SPIRIVA RESPIMAT 2.5 MCG/ACT AERS [Pharmacy Med Name: Spiriva Respimat 2.5 MCG/ACT Inhalation Aerosol Solution] 4 g 0  ?  Sig: INHALE 1 SPRAY(S) BY MOUTH ONCE DAILY  ?  ? Pulmonology:  Anticholinergic Agents Passed - 10/14/2021  5:44 PM  ?  ?  Passed - Valid encounter within last 12 months  ?  Recent Outpatient Visits   ? ?      ? 1 month ago Routine general medical examination at a health care facility  ? [facility] [place], Megan P, DO  ? 3 months ago Sore throat  ? [facility] [place], Megan P, DO  ? 4 months ago Other fatigue  ? [facility] [place], [place] P, DO  ? 7 months ago Moderate persistent asthma without complication  ? [facility] [place], [place] P, DO  ? 1 year ago Routine general medical examination at a health care facility  ? [facility] [place], [place] P, DO  ? ?  ?  ?Future Appointments   ? ?        ? In 4 months Laural Benes, Oralia Rud, DO [facility]  ? ?  ? ?  ?  ?  ? ? ?

## 2021-11-20 ENCOUNTER — Ambulatory Visit: Payer: No Typology Code available for payment source | Admitting: Nurse Practitioner

## 2021-11-20 ENCOUNTER — Encounter: Payer: Self-pay | Admitting: Nurse Practitioner

## 2021-11-20 VITALS — BP 101/69 | HR 87 | Temp 98.1°F | Wt 244.0 lb

## 2021-11-20 DIAGNOSIS — R109 Unspecified abdominal pain: Secondary | ICD-10-CM | POA: Insufficient documentation

## 2021-11-20 LAB — URINALYSIS, ROUTINE W REFLEX MICROSCOPIC
Bilirubin, UA: NEGATIVE
Glucose, UA: NEGATIVE
Ketones, UA: NEGATIVE
Leukocytes,UA: NEGATIVE
Nitrite, UA: NEGATIVE
RBC, UA: NEGATIVE
Specific Gravity, UA: 1.02 (ref 1.005–1.030)
Urobilinogen, Ur: 0.2 mg/dL (ref 0.2–1.0)
pH, UA: 7.5 (ref 5.0–7.5)

## 2021-11-20 MED ORDER — CYCLOBENZAPRINE HCL 10 MG PO TABS: 10.0000 mg | ORAL_TABLET | Freq: Three times a day (TID) | ORAL | 0 refills | Status: DC | PRN

## 2021-11-20 NOTE — Assessment & Plan Note (Signed)
Acute for 24 hours, suspect more musculoskeletal.  UA overall reassuring.  Will obtain labs to further assess: CBC, CMP, GGT.  Trial Flexeril 10 MG TID PRN for pain.  Recommend use of heating pad at home as needed + Tylenol, may also try a dose of her Toradol at home (which she has for migraines).  Try TENS machine along muscle line.  Return to office for worsening or ongoing. ?

## 2021-11-20 NOTE — Patient Instructions (Addendum)
Muscle Strain A muscle strain is an injury that occurs when a muscle is stretched beyond its normal length. Usually, a small number of muscle fibers are torn when this happens. There are three types of muscle strains. First-degree strains have the least amount of muscle fiber tearing and the least amount of pain. Second-degree and third-degree strains have more tearing and pain. Usually, recovery from muscle strain takes 1-2 weeks. Complete healing normally takes 5-6 weeks. What are the causes? This condition is caused when a sudden, violent force is placed on a muscle and stretches it too far. This may occur with a fall, while lifting, or during sports. What increases the risk? This condition is more likely to develop in athletes and people who are physically active. What are the signs or symptoms? Symptoms of this condition include: Pain. Tenderness. Bruising. Swelling. Trouble using the muscle. How is this diagnosed? This condition is diagnosed based on a physical exam and your medical history. Tests may also be done, including an X-ray, ultrasound, or MRI. How is this treated? This condition is initially treated with PRICE therapy. This therapy involves: Protecting the muscle from being injured again. Resting the injured muscle. Icing the injured muscle. Applying pressure (compression) to the injured muscle. This may be done with a splint or elastic bandage. Raising (elevating) the injured muscle. Your health care provider may also recommend medicine for pain. Follow these instructions at home: If you have a removable splint: Wear the splint as told by your health care provider. Remove it only as told by your health care provider. Check the skin around the splint every day. Tell your health care provider about any concerns. Loosen the splint if your fingers or toes tingle, become numb, or turn cold and blue. Keep the splint clean. If the splint is not waterproof: Do not let it get  wet. Cover it with a watertight covering when you take a bath or a shower. Managing pain, stiffness, and swelling  If directed, put ice on the injured area. To do this: If you have a removable splint, remove it as told by your health care provider. Put ice in a plastic bag. Place a towel between your skin and the bag. Leave the ice on for 20 minutes, 2-3 times a day. Remove the ice if your skin turns bright red. This is very important. If you cannot feel pain, heat, or cold, you have a greater risk of damage to the area. Move your fingers or toes often to reduce stiffness and swelling. Raise (elevate) the injured area above the level of your heart while you are sitting or lying down. Wear an elastic bandage as told by your health care provider. Make sure that it is not too tight. General instructions Take over-the-counter and prescription medicines only as told by your health care provider. Treatment may include muscle relaxants or medicines for pain and inflammation that are taken by mouth or applied to the skin. Restrict your activity and rest the injured muscle as told by your health care provider. Gentle movements may be allowed. If physical therapy was prescribed, do exercises as told by your health care provider. Do not put pressure on any part of the splint until it is fully hardened. This may take several hours. Do not use any products that contain nicotine or tobacco. These products include cigarettes, chewing tobacco, and vaping devices, such as e-cigarettes. If you need help quitting, ask your health care provider. Ask your health care provider when it is   safe to drive if you have a splint. Keep all follow-up visits. This is important. How is this prevented? Warm up before exercising. This helps to prevent future muscle strains. Contact a health care provider if: You have more pain or swelling in the injured area. Get help right away if: You have numbness or tingling in the  injured area. You lose a lot of strength in the injured area. Summary A muscle strain is an injury that occurs when a muscle is stretched beyond its normal length. This condition is caused when a sudden, violent force is placed on a muscle and stretches it too far. This condition is initially treated with PRICE therapy, which involves protecting, resting, icing, compressing, and elevating. Gentle movements may be allowed. If physical therapy was prescribed, do exercises as told by your health care provider. This information is not intended to replace advice given to you by your health care provider. Make sure you discuss any questions you have with your health care provider. Document Revised: 09/10/2020 Document Reviewed: 09/10/2020 [employer] Patient Education  2023 [employer]  

## 2021-11-20 NOTE — Progress Notes (Signed)
? ?BP 101/69   Pulse 87   Temp 98.1 ?F (36.7 ?C) (Oral)   Wt 244 lb (110.7 kg)   SpO2 98%   BMI 37.76 kg/m?   ? ?Subjective:  ? ? Patient ID: Miranda Mcdonald, female    DOB: 08-16-1990, 31 y.o.   MRN: [id number] ? ?HPI: ?Miranda Mcdonald is a 31 y.o. female ? ?Chief Complaint  ?Patient presents with  ? Flank Pain  ?  Patient is here for R side discomfort. Patient says yesterday morning when she woke up, she noticed some pain from her R side of her back radiating to the mid-section of her back. Patient says it feels like a knife is coming out of back and into the front. Patient states she has tried warm-compress and Ibuprofen. Patient states the pain is constant and woke her up out of sleep over in the night. Patient states the pain has since ease off a little bit.   ? ?BACK PAIN ?Started yesterday to right mid-back radiating to the front.  Still has gall bladder.  Denies any nausea or vomiting with this.   ?Duration: days ?Mechanism of injury: no trauma ?Location: Right and midline ?Onset: sudden ?Severity: 7/10 -- at 3 am it was the worst, felt like was being ripped apart inside ?Quality: sharp, shooting, stabbing, and throbbing ?Frequency: constant ?Radiation: is she moves it will go down back some ?Aggravating factors: movement ?Alleviating factors: nothing ?Status: stable ?Treatments attempted: rest, heat, APAP, and ibuprofen  ?Relief with NSAIDs?: No NSAIDs Taken ?Nighttime pain:  no ?Paresthesias / decreased sensation:  no ?Bowel / bladder incontinence:  no ?Fevers:  no ?Dysuria / urinary frequency:  no  ? ?Relevant past medical, surgical, family and social history reviewed and updated as indicated. Interim medical history since our last visit reviewed. ?Allergies and medications reviewed and updated. ? ?Review of Systems  ?Constitutional:  Negative for activity change, appetite change, diaphoresis, fatigue and fever.  ?Respiratory:  Negative for cough, chest tightness and shortness of breath.   ?Cardiovascular:   Negative for chest pain, palpitations and leg swelling.  ?Gastrointestinal: Negative.   ?Musculoskeletal:  Positive for back pain.  ?Neurological: Negative.   ?Psychiatric/Behavioral: Negative.    ? ?Per HPI unless specifically indicated above ? ?   ?Objective:  ?  ?BP 101/69   Pulse 87   Temp 98.1 ?F (36.7 ?C) (Oral)   Wt 244 lb (110.7 kg)   SpO2 98%   BMI 37.76 kg/m?   ?Wt Readings from Last 3 Encounters:  ?11/20/21 244 lb (110.7 kg)  ?09/04/21 239 lb 12.8 oz (108.8 kg)  ?07/12/21 239 lb 6.4 oz (108.6 kg)  ?  ?Physical Exam ?Vitals and nursing note reviewed.  ?Constitutional:   ?   General: She is awake. She is not in acute distress. ?   Appearance: She is well-developed and well-groomed. She is obese. She is not ill-appearing or toxic-appearing.  ?HENT:  ?   Head: Normocephalic.  ?   Right Ear: Hearing normal.  ?   Left Ear: Hearing normal.  ?Eyes:  ?   General: Lids are normal.     ?   Right eye: No discharge.     ?   Left eye: No discharge.  ?   Conjunctiva/sclera: Conjunctivae normal.  ?   Pupils: Pupils are equal, round, and reactive to light.  ?Neck:  ?   Thyroid: No thyromegaly.  ?   Vascular: No carotid bruit.  ?Cardiovascular:  ?   Rate and Rhythm:  Normal rate and regular rhythm.  ?   Heart sounds: Normal heart sounds. No murmur heard. ?  No gallop.  ?Pulmonary:  ?   Effort: Pulmonary effort is normal. No accessory muscle usage or respiratory distress.  ?   Breath sounds: Normal breath sounds.  ?Abdominal:  ?   General: Bowel sounds are normal. There is no distension.  ?   Palpations: Abdomen is soft.  ?   Tenderness: There is no abdominal tenderness. There is no right CVA tenderness, left CVA tenderness or guarding. Negative signs include Murphy's sign.  ?Musculoskeletal:  ?   Cervical back: Normal range of motion and neck supple.  ?   Thoracic back: Spasms and tenderness present. No swelling or bony tenderness. Normal range of motion.  ?     Back: ? ?   Right lower leg: No edema.  ?   Left lower  leg: No edema.  ?   Comments: No rashes noted to back.  ?Lymphadenopathy:  ?   Cervical: No cervical adenopathy.  ?Skin: ?   General: Skin is warm and dry.  ?Neurological:  ?   Mental Status: She is alert and oriented to person, place, and time.  ?Psychiatric:     ?   Attention and Perception: Attention normal.     ?   Mood and Affect: Mood normal.     ?   Speech: Speech normal.     ?   Behavior: Behavior normal. Behavior is cooperative.     ?   Thought Content: Thought content normal.  ? ? ?Results for orders placed or performed in visit on 11/20/21  ?Urinalysis, Routine w reflex microscopic  ?Result Value Ref Range  ? Specific Gravity, UA 1.020 1.005 - 1.030  ? pH, UA 7.5 5.0 - 7.5  ? Color, UA Yellow Yellow  ? Appearance Ur Clear Clear  ? Leukocytes,UA Negative Negative  ? Protein,UA Trace (A) Negative/Trace  ? Glucose, UA Negative Negative  ? Ketones, UA Negative Negative  ? RBC, UA Negative Negative  ? Bilirubin, UA Negative Negative  ? Urobilinogen, Ur 0.2 0.2 - 1.0 mg/dL  ? Nitrite, UA Negative Negative  ? ?   ?Assessment & Plan:  ? ?Problem List Items Addressed This Visit   ? ?  ? Other  ? Acute right flank pain - Primary  ?  Acute for 24 hours, suspect more musculoskeletal.  UA overall reassuring.  Will obtain labs to further assess: CBC, CMP, GGT.  Trial Flexeril 10 MG TID PRN for pain.  Recommend use of heating pad at home as needed + Tylenol, may also try a dose of her Toradol at home (which she has for migraines).  Try TENS machine along muscle line.  Return to office for worsening or ongoing. ? ?  ?  ? Relevant Orders  ? Urinalysis, Routine w reflex microscopic (Completed)  ? Comprehensive metabolic panel  ? Gamma GT  ? CBC with Differential/Platelet  ?  ? ?Follow up plan: ?Return if symptoms worsen or fail to improve. ? ? ? ? ? ?

## 2021-11-21 LAB — CBC WITH DIFFERENTIAL/PLATELET
Basophils Absolute: 0 10*3/uL (ref 0.0–0.2)
Basos: 1 %
EOS (ABSOLUTE): 0.2 10*3/uL (ref 0.0–0.4)
Eos: 2 %
Hematocrit: 42.6 % (ref 34.0–46.6)
Hemoglobin: 13.9 g/dL (ref 11.1–15.9)
Immature Grans (Abs): 0 10*3/uL (ref 0.0–0.1)
Immature Granulocytes: 0 %
Lymphocytes Absolute: 2.1 10*3/uL (ref 0.7–3.1)
Lymphs: 28 %
MCH: 27.6 pg (ref 26.6–33.0)
MCHC: 32.6 g/dL (ref 31.5–35.7)
MCV: 85 fL (ref 79–97)
Monocytes Absolute: 0.5 10*3/uL (ref 0.1–0.9)
Monocytes: 7 %
Neutrophils Absolute: 4.6 10*3/uL (ref 1.4–7.0)
Neutrophils: 62 %
Platelets: 277 10*3/uL (ref 150–450)
RBC: 5.03 x10E6/uL (ref 3.77–5.28)
RDW: 12.9 % (ref 11.7–15.4)
WBC: 7.5 10*3/uL (ref 3.4–10.8)

## 2021-11-21 LAB — COMPREHENSIVE METABOLIC PANEL
ALT: 18 IU/L (ref 0–32)
AST: 16 IU/L (ref 0–40)
Albumin/Globulin Ratio: 1.8 (ref 1.2–2.2)
Albumin: 4.6 g/dL (ref 3.8–4.8)
Alkaline Phosphatase: 67 IU/L (ref 44–121)
BUN/Creatinine Ratio: 12 (ref 9–23)
BUN: 10 mg/dL (ref 6–20)
Bilirubin Total: 0.4 mg/dL (ref 0.0–1.2)
CO2: 25 mmol/L (ref 20–29)
Calcium: 9.5 mg/dL (ref 8.7–10.2)
Chloride: 101 mmol/L (ref 96–106)
Creatinine, Ser: 0.84 mg/dL (ref 0.57–1.00)
Globulin, Total: 2.5 g/dL (ref 1.5–4.5)
Glucose: 91 mg/dL (ref 70–99)
Potassium: 4.3 mmol/L (ref 3.5–5.2)
Sodium: 139 mmol/L (ref 134–144)
Total Protein: 7.1 g/dL (ref 6.0–8.5)
eGFR: 95 mL/min/{1.73_m2} (ref >59)

## 2021-11-21 LAB — GAMMA GT: GGT: 10 IU/L (ref 0–60)

## 2021-11-21 NOTE — Progress Notes (Signed)
Contacted via MyChart

## 2021-12-11 ENCOUNTER — Other Ambulatory Visit: Payer: Self-pay | Admitting: Family Medicine

## 2021-12-11 NOTE — Telephone Encounter (Signed)
Requested Prescriptions  Pending Prescriptions Disp Refills  . SPIRIVA RESPIMAT 2.5 MCG/ACT AERS [Pharmacy Med Name: Spiriva Respimat 2.5 MCG/ACT Inhalation Aerosol Solution] 4 g 0    Sig: INHALE 1 SPRAY(S) BY MOUTH ONCE DAILY     Pulmonology:  Anticholinergic Agents Passed - 12/11/2021 11:36 AM      Passed - Valid encounter within last 12 months    Recent Outpatient Visits          3 weeks ago Acute right flank pain   [facility] [place], Corrie Dandy T, NP   3 months ago Routine general medical examination at a health care facility   [facility], [place] P, DO   5 months ago Sore throat   [facility], [place], DO   6 months ago Other fatigue   [facility] [place], Megan P, DO   9 months ago Moderate persistent asthma without complication   [facility] Dorcas Carrow, DO      Future Appointments            In 2 months Laural Benes, Oralia Rud, DO [facility], [facility]

## 2022-01-02 ENCOUNTER — Other Ambulatory Visit: Payer: Self-pay | Admitting: Family Medicine

## 2022-01-02 NOTE — Telephone Encounter (Signed)
Requested Prescriptions  Pending Prescriptions Disp Refills  . SPIRIVA RESPIMAT 2.5 MCG/ACT AERS [Pharmacy Med Name: Spiriva Respimat 2.5 MCG/ACT Inhalation Aerosol Solution] 4 g 0    Sig: INHALE 1 SPRAY(S) BY MOUTH ONCE DAILY     Pulmonology:  Anticholinergic Agents Passed - 01/02/2022 10:07 AM      Passed - Valid encounter within last 12 months    Recent Outpatient Visits          1 month ago Acute right flank pain   [facility] [place], Corrie Dandy T, NP   4 months ago Routine general medical examination at a health care facility   [facility], [place] P, DO   5 months ago Sore throat   [facility] [place], [place], DO   6 months ago Other fatigue   [facility] [place], Megan P, DO   10 months ago Moderate persistent asthma without complication   [facility] Dorcas Carrow, DO      Future Appointments            In 2 months Laural Benes, Oralia Rud, DO [facility] [place], [facility]

## 2022-02-28 ENCOUNTER — Other Ambulatory Visit: Payer: Self-pay | Admitting: Family Medicine

## 2022-02-28 NOTE — Telephone Encounter (Signed)
Requested Prescriptions  Pending Prescriptions Disp Refills  . ADVAIR DISKUS 100-50 MCG/ACT AEPB [Pharmacy Med Name: Advair Diskus 100-50 MCG/DOSE Inhalation Aerosol Powder Breath Activated] 60 each 0    Sig: INHALE 1 PUFF INTO THE LUNGS TWICE DAILY     Pulmonology:  Combination Products Passed - 02/28/2022 10:09 AM      Passed - Valid encounter within last 12 months    Recent Outpatient Visits          3 months ago Acute right flank pain   [facility] [place], Corrie Dandy T, NP   5 months ago Routine general medical examination at a health care facility   [facility], [place] P, DO   7 months ago Sore throat   [facility] [place], [place], DO   8 months ago Other fatigue   [facility] [place], [place], DO   12 months ago Moderate persistent asthma without complication   [facility] [place], Oralia Rud, DO      Future Appointments            In 1 week Laural Benes, Oralia Rud, DO [facility], [facility]

## 2022-03-07 ENCOUNTER — Ambulatory Visit: Payer: No Typology Code available for payment source | Admitting: Family Medicine

## 2022-03-20 ENCOUNTER — Other Ambulatory Visit: Payer: Self-pay | Admitting: Family Medicine

## 2022-03-21 ENCOUNTER — Other Ambulatory Visit: Payer: Self-pay | Admitting: Family Medicine

## 2022-03-21 NOTE — Telephone Encounter (Signed)
Requested Prescriptions  Pending Prescriptions Disp Refills  . SPIRIVA RESPIMAT 2.5 MCG/ACT AERS [Pharmacy Med Name: Spiriva Respimat 2.5 MCG/ACT Inhalation Aerosol Solution] 4 g 0    Sig: INHALE 1 SPRAY BY MOUTH ONCE DAILY     Pulmonology:  Anticholinergic Agents Passed - 03/20/2022  9:50 AM      Passed - Valid encounter within last 12 months    Recent Outpatient Visits          4 months ago Acute right flank pain   [facility] [place], Corrie Dandy T, NP   6 months ago Routine general medical examination at a health care facility   [facility], [place] P, DO   8 months ago Sore throat   [facility] [place], Megan P, DO   9 months ago Other fatigue   [facility] [place], [place], DO   1 year ago Moderate persistent asthma without complication   [facility] [place], Megan P, DO

## 2022-03-24 NOTE — Telephone Encounter (Signed)
Duplicate request. Requested Prescriptions  Pending Prescriptions Disp Refills  . [place] 2.5 MCG/ACT AERS [Pharmacy Med Name: Spiriva Respimat 2.5 MCG/ACT Inhalation Aerosol Solution] 4 g 0    Sig: INHALE 1 SPRAY BY MOUTH ONCE DAILY     Pulmonology:  Anticholinergic Agents Passed - 03/21/2022  4:36 PM      Passed - Valid encounter within last 12 months    Recent Outpatient Visits          4 months ago Acute right flank pain   [place], Henrine Screws T, NP   6 months ago Routine general medical examination at a health care facility   [facility], [place] P, DO   8 months ago Sore throat   [place], Megan P, DO   9 months ago Other fatigue   [place], [place], DO   1 year ago Moderate persistent asthma without complication   [place], Megan P, DO

## 2022-04-13 ENCOUNTER — Other Ambulatory Visit: Payer: Self-pay | Admitting: Family Medicine

## 2022-04-14 NOTE — Telephone Encounter (Signed)
Requested Prescriptions  Pending Prescriptions Disp Refills  . [place] 2.5 MCG/ACT AERS [Pharmacy Med Name: Spiriva Respimat 2.5 MCG/ACT Inhalation Aerosol Solution] 4 g 1    Sig: INHALE 1 SPRAY(S) BY MOUTH ONCE DAILY     Pulmonology:  Anticholinergic Agents Passed - 04/13/2022 11:07 AM      Passed - Valid encounter within last 12 months    Recent Outpatient Visits          4 months ago Acute right flank pain   [place], Henrine Screws T, NP   7 months ago Routine general medical examination at a health care facility   [facility], [place] P, DO   9 months ago Sore throat   [place], Megan P, DO   10 months ago Other fatigue   [place], [place], DO   1 year ago Moderate persistent asthma without complication   [place], Megan P, DO

## 2022-05-16 ENCOUNTER — Other Ambulatory Visit: Payer: Self-pay | Admitting: Family Medicine

## 2022-05-16 NOTE — Telephone Encounter (Signed)
Requested Prescriptions  Pending Prescriptions Disp Refills   ADVAIR DISKUS 100-50 MCG/ACT AEPB [Pharmacy Med Name: Advair Diskus 100-50 MCG/DOSE Inhalation Aerosol Powder Breath Activated] 60 each 0    Sig: INHALE 1 PUFF INTO THE LUNGS TWICE DAILY     Pulmonology:  Combination Products Passed - 05/16/2022  9:36 AM      Passed - Valid encounter within last 12 months    Recent Outpatient Visits           5 months ago Acute right flank pain   [facility] [place], Corrie Dandy T, NP   8 months ago Routine general medical examination at a health care facility   [facility], [place] P, DO   10 months ago Sore throat   [facility] [place], Megan P, DO   11 months ago Other fatigue   [facility] [place], [place], DO   1 year ago Moderate persistent asthma without complication   [facility] [place], Megan P, DO

## 2022-07-14 ENCOUNTER — Other Ambulatory Visit: Payer: Self-pay | Admitting: Family Medicine

## 2022-07-15 NOTE — Telephone Encounter (Signed)
Requested Prescriptions  Pending Prescriptions Disp Refills   ADVAIR DISKUS 100-50 MCG/ACT AEPB [Pharmacy Med Name: Advair Diskus 100-50 MCG/DOSE Inhalation Aerosol Powder Breath Activated] 60 each 0    Sig: INHALE 1 DOSE BY MOUTH TWICE DAILY     Pulmonology:  Combination Products Passed - 07/14/2022  6:43 PM      Passed - Valid encounter within last 12 months    Recent Outpatient Visits           7 months ago Acute right flank pain   [facility] [place], Henrine Screws T, NP   10 months ago Routine general medical examination at a health care facility   [facility], [place], DO   1 year ago Sore throat   [place], [place], DO   1 year ago Other fatigue   [place], [place], DO   1 year ago Moderate persistent asthma without complication   [place], Megan P, DO

## 2022-07-24 ENCOUNTER — Other Ambulatory Visit: Payer: Self-pay | Admitting: Family Medicine

## 2022-07-24 NOTE — Telephone Encounter (Signed)
Requested Prescriptions  Pending Prescriptions Disp Refills   WIXELA INHUB 100-50 MCG/ACT AEPB [Pharmacy Med Name: WIXELA INHUB100/50  AER] 60 each 0    Sig: INHALE 1 PUFF TWICE DAILY     Pulmonology:  Combination Products Passed - 07/24/2022  8:31 AM      Passed - Valid encounter within last 12 months    Recent Outpatient Visits           8 months ago Acute right flank pain   [facility] , Henrine Screws T, NP   10 months ago Routine general medical examination at a health care facility   [facility], [place], DO   1 year ago Sore throat   [place], [place], DO   1 year ago Other fatigue   [place], DO   1 year ago Moderate persistent asthma without complication   [place], Megan P, DO

## 2022-09-08 ENCOUNTER — Telehealth: Payer: Self-pay

## 2022-09-08 NOTE — Telephone Encounter (Signed)
-----   Message from Georgina Peer, [place] sent at 09/05/2022  1:29 PM EST ----- Needs TOC completed please.

## 2022-09-08 NOTE — Transitions of Care (Post Inpatient/ED Visit) (Signed)
   09/08/2022  Name: Miranda Mcdonald MRN: [id number] DOB: Nov 13, 1990  Today's TOC FU Call Status: Today's TOC FU Call Status:: Successful TOC FU Call Competed TOC FU Call Complete Date: 09/08/22  Transition Care Management Follow-up Telephone Call Date of Discharge: 09/03/22 Discharge Facility[facility]) Type of Discharge: Emergency Department Reason for ED Visit: Other: How have you been since you were released from the hospital?: Better Any questions or concerns?: No  Items Reviewed: Did you receive and understand the discharge instructions provided?: Yes Medications obtained and verified?: No Medications Not Reviewed Reasons:: Advised Patient to Call Provider Office Any new allergies since your discharge?: No Dietary orders reviewed?: NA Do you have support at home?: No  Home Care and Equipment/Supplies: [place] Ordered?: NA Any new equipment or medical supplies ordered?: NA  Functional Questionnaire: Do you need assistance with bathing/showering or dressing?: No Do you need assistance with meal preparation?: No Do you need assistance with eating?: No Do you have difficulty maintaining continence: No Do you need assistance with getting out of bed/getting out of a chair/moving?: No Do you have difficulty managing or taking your medications?: No  Folllow up appointments reviewed: PCP Follow-up appointment confirmed?: NA Specialist Hospital Follow-up appointment confirmed?: NA Do you need transportation to your follow-up appointment?: No Do you understand care options if your condition(s) worsen?: Yes-patient verbalized understanding    [place], [place]

## 2022-09-09 ENCOUNTER — Other Ambulatory Visit: Payer: Self-pay | Admitting: Family Medicine

## 2022-09-09 NOTE — Telephone Encounter (Signed)
Requested Prescriptions  Pending Prescriptions Disp Refills   ADVAIR DISKUS 100-50 MCG/ACT AEPB [Pharmacy Med Name: Advair Diskus 100-50 MCG/DOSE Inhalation Aerosol Powder Breath Activated] 60 each 0    Sig: INHALE 1 DOSE BY MOUTH TWICE DAILY     Pulmonology:  Combination Products Passed - 09/09/2022  9:09 AM      Passed - Valid encounter within last 12 months    Recent Outpatient Visits           9 months ago Acute right flank pain   [place], Henrine Screws T, NP   1 year ago Routine general medical examination at a health care facility   [place], [place], DO   1 year ago Sore throat   [place], [place], DO   1 year ago Other fatigue   [place], [place], DO   1 year ago Moderate persistent asthma without complication   [place], [place], DO               Patient will need an office visit for further refills.

## 2022-10-07 ENCOUNTER — Encounter: Payer: Self-pay | Admitting: Family Medicine

## 2022-10-07 MED ORDER — FLUTICASONE-SALMETEROL 100-50 MCG/ACT IN AEPB: 1.0000 | INHALATION_SPRAY | Freq: Two times a day (BID) | RESPIRATORY_TRACT | 3 refills | Status: DC

## 2022-10-30 ENCOUNTER — Encounter: Payer: Self-pay | Admitting: Family Medicine

## 2022-12-26 ENCOUNTER — Ambulatory Visit (INDEPENDENT_AMBULATORY_CARE_PROVIDER_SITE_OTHER): Payer: 59 | Admitting: Family Medicine

## 2022-12-26 ENCOUNTER — Encounter: Payer: Self-pay | Admitting: Family Medicine

## 2022-12-26 VITALS — BP 107/71 | HR 85 | Temp 98.2°F | Ht 67.0 in | Wt 244.6 lb

## 2022-12-26 DIAGNOSIS — N926 Irregular menstruation, unspecified: Secondary | ICD-10-CM | POA: Diagnosis not present

## 2022-12-26 DIAGNOSIS — J4540 Moderate persistent asthma, uncomplicated: Secondary | ICD-10-CM

## 2022-12-26 DIAGNOSIS — Z0000 Encounter for general adult medical examination without abnormal findings: Secondary | ICD-10-CM

## 2022-12-26 DIAGNOSIS — E559 Vitamin D deficiency, unspecified: Secondary | ICD-10-CM | POA: Diagnosis not present

## 2022-12-26 DIAGNOSIS — F419 Anxiety disorder, unspecified: Secondary | ICD-10-CM

## 2022-12-26 DIAGNOSIS — G43109 Migraine with aura, not intractable, without status migrainosus: Secondary | ICD-10-CM

## 2022-12-26 DIAGNOSIS — N898 Other specified noninflammatory disorders of vagina: Secondary | ICD-10-CM

## 2022-12-26 MED ORDER — SUMATRIPTAN SUCCINATE 6 MG/0.5ML ~~LOC~~ SOAJ: 6.0000 mg | Freq: Every day | SUBCUTANEOUS | 12 refills | Status: DC | PRN

## 2022-12-26 MED ORDER — LEVALBUTEROL TARTRATE 45 MCG/ACT IN AERO: INHALATION_SPRAY | RESPIRATORY_TRACT | 6 refills | Status: DC

## 2022-12-26 MED ORDER — SUMATRIPTAN SUCCINATE 100 MG PO TABS: 100.0000 mg | ORAL_TABLET | ORAL | 12 refills | Status: DC | PRN

## 2022-12-26 MED ORDER — SPIRIVA RESPIMAT 2.5 MCG/ACT IN AERS: INHALATION_SPRAY | RESPIRATORY_TRACT | 1 refills | Status: DC

## 2022-12-26 MED ORDER — FLUTICASONE-SALMETEROL 100-50 MCG/ACT IN AEPB: 1.0000 | INHALATION_SPRAY | Freq: Two times a day (BID) | RESPIRATORY_TRACT | 3 refills | Status: DC

## 2022-12-26 MED ORDER — BUSPIRONE HCL 5 MG PO TABS: ORAL_TABLET | ORAL | 1 refills | Status: DC

## 2022-12-26 MED ORDER — SERTRALINE HCL 50 MG PO TABS: 50.0000 mg | ORAL_TABLET | Freq: Every day | ORAL | 1 refills | Status: DC

## 2022-12-26 NOTE — Progress Notes (Signed)
BP 107/71   Pulse 85   Temp 98.2 F (36.8 C) (Oral)   Ht 5\' 7"  (1.702 m)   Wt 244 lb 9.6 oz (110.9 kg)   SpO2 99%   BMI 38.31 kg/m    Subjective:    Patient ID: Miranda Mcdonald, female    DOB: 15-Dec-1990, 32 y.o.   MRN: [id number]  HPI: Miranda Mcdonald is a 32 y.o. female presenting on 12/26/2022 for comprehensive medical examination. Current medical complaints include:  ABNORMAL MENSTRUAL PERIODS G1P1001 Duration: 2-3 months Average interval between menses: totally at random Length of menses: 2-3 days Flow: heavy Dysmenorrhea: yes Intermenstrual bleeding:no Postcoital bleeding: no Contraception: vasectomy Menarche at age: 83 Sexual activity: In a Monogamous Relationship History of sexually transmitted diseases: no History GYN procedures: no Abnormal pap smears: many years ago, nothing recently   Dyspareunia: yes Vaginal discharge:yes Abdominal pain: no Galactorrhea: no Hirsuitism: yes Frequent bruising/mucosal bleeding: no Double vision:no Hot flashes: yes  ANXIETY/STRESS Duration: chronic Status:stable Anxious mood: yes  Excessive worrying: no Irritability: no  Sweating: no Nausea: no Palpitations:no Hyperventilation: no Panic attacks: no Agoraphobia: no  Obscessions/compulsions: no Depressed mood: yes    12/26/2022    4:55 PM 11/20/2021   11:20 AM 09/04/2021   11:20 AM 07/12/2021    3:38 PM 06/10/2021    3:43 PM  Depression screen PHQ 2/9  Decreased Interest 0 1 1 1 1   Down, Depressed, Hopeless 0 0 1 1 1   PHQ - 2 Score 0 1 2 2 2   Altered sleeping 1 1 1 3 1   Tired, decreased energy 2 1 1 1 1   Change in appetite 0 0 0 0 0  Feeling bad or failure about yourself  0 0 0 0 2  Trouble concentrating 1 1 2 1 3   Moving slowly or fidgety/restless 0 0 1 0 0  Suicidal thoughts 0 0 0 0 0  PHQ-9 Score 4 4 7 7 9   Difficult doing work/chores Not difficult at all  Somewhat difficult  Somewhat difficult   Anhedonia: no Weight changes: no Insomnia: no   Hypersomnia:  no Fatigue/loss of energy: yes Feelings of worthlessness: no Feelings of guilt: no Impaired concentration/indecisiveness: no Suicidal ideations: no  Crying spells: no Recent Stressors/Life Changes: no   Relationship problems: no   Family stress: no     Financial stress: no    Job stress: no    Recent death/loss: no  Migraines have been doing well. Tolerating medicine. Feeling well. No concerns.   ASTHMA Asthma status: controlled Satisfied with current treatment?: yes Albuterol/rescue inhaler frequency: occasionally Dyspnea frequency: occasionally Wheezing frequency: occasionally Cough frequency: rarely Nocturnal symptom frequency: never  Limitation of activity: no Current upper respiratory symptoms: no Aerochamber/spacer use: no Visits to ER or Urgent Care in past year: no Pneumovax: Not up to Date Influenza: Not up to Date  She currently lives with: Menopausal Symptoms: yes  Depression Screen done today and results listed below:     12/26/2022    4:55 PM 11/20/2021   11:20 AM 09/04/2021   11:20 AM 07/12/2021    3:38 PM 06/10/2021    3:43 PM  Depression screen PHQ 2/9  Decreased Interest 0 1 1 1 1   Down, Depressed, Hopeless 0 0 1 1 1   PHQ - 2 Score 0 1 2 2 2   Altered sleeping 1 1 1 3 1   Tired, decreased energy 2 1 1 1 1   Change in appetite 0 0 0 0 0  Feeling bad or  failure about yourself  0 0 0 0 2  Trouble concentrating 1 1 2 1 3   Moving slowly or fidgety/restless 0 0 1 0 0  Suicidal thoughts 0 0 0 0 0  PHQ-9 Score 4 4 7 7 9   Difficult doing work/chores Not difficult at all  Somewhat difficult  Somewhat difficult    Past Medical History:  Past Medical History:  Diagnosis Date   Asthma    Broken arm    Closed fracture distal radius and ulna    Closed right radial fracture    Heart palpitations 03/19/2015   Hypoglycemia    Lactose intolerance in adult    Migraines    Post traumatic stress disorder (PTSD)     Surgical History:  Past Surgical History:   Procedure Laterality Date   CLAVICLE SURGERY Left 2007   CLAVICLE SURGERY     surgery to remove pin from first surgery   KNEE SURGERY Left    WISDOM TOOTH EXTRACTION      Medications:  Current Outpatient Medications on File Prior to Visit  Medication Sig   ketorolac (TORADOL) 10 MG tablet Take 1 tablet (10 mg total) by mouth as needed. Max 3 per day   promethazine (PHENERGAN) 25 MG tablet Take 1 tablet (25 mg total) by mouth every 8 (eight) hours as needed for nausea or vomiting.   No current facility-administered medications on file prior to visit.    Allergies:  Allergies  Allergen Reactions   Meloxicam Shortness Of Breath and Other (See Comments)    Difficulty breathing    Tape Rash    Adhesive tapes   Morphine And Codeine Other (See Comments)    Mean, angry, violent    Percocet [Oxycodone-Acetaminophen] Other (See Comments)    Migraine    Social History:  Social History   Socioeconomic History   Marital status: Married    Spouse name: Not on file   Number of children: Not on file   Years of education: Not on file   Highest education level: Not on file  Occupational History   Not on file  Tobacco Use   Smoking status: Never   Smokeless tobacco: Never  Vaping Use   Vaping Use: Never used  Substance and Sexual Activity   Alcohol use: Yes    Comment: occasional- pt states once in a while   Drug use: No   Sexual activity: Yes    Birth control/protection: None  Other Topics Concern   Not on file  Social History Narrative   Not on file   Social Determinants of Health   Financial Resource Strain: Not on file  Food Insecurity: Not on file  Transportation Needs: Not on file  Physical Activity: Not on file  Stress: Not on file  Social Connections: Not on file  Intimate Partner Violence: Not on file   Social History   Tobacco Use  Smoking Status Never  Smokeless Tobacco Never   Social History   Substance and Sexual Activity  Alcohol Use Yes    Comment: occasional- pt states once in a while    Family History:  Family History  Problem Relation Age of Onset   Bipolar disorder Mother    Hypothyroidism Mother    Restless legs syndrome Mother    Neuropathy Father    Depression Father    Bipolar disorder Sister    Hypothyroidism Sister    Bipolar disorder Brother    Heart murmur Daughter    Speech disorder Daughter  Breast cancer Maternal Aunt    Cancer Maternal Aunt        breast   Arthritis Maternal Grandmother    Hypotension Maternal Grandmother    Diabetes Maternal Grandfather    Depression Maternal Grandfather    Hyperlipidemia Maternal Grandfather    Liver disease Maternal Grandfather        fatty liver   Bipolar disorder Maternal Grandfather    Hypothyroidism Maternal Grandfather    Hypertension Paternal Grandmother    Depression Paternal Grandmother    Depression Paternal Grandfather    Breast cancer Other    Breast cancer Other     Past medical history, surgical history, medications, allergies, family history and social history reviewed with patient today and changes made to appropriate areas of the chart.   Review of Systems  Constitutional:  Positive for diaphoresis. Negative for chills, fever, malaise/fatigue and weight loss.  HENT: Negative.    Eyes: Negative.   Respiratory: Negative.    Cardiovascular:  Positive for leg swelling. Negative for chest pain, palpitations, orthopnea, claudication and PND.  Gastrointestinal:  Positive for nausea. Negative for abdominal pain, blood in stool, constipation, diarrhea, heartburn, melena and vomiting.  Genitourinary: Negative.   Musculoskeletal: Negative.   Skin: Negative.   Endo/Heme/Allergies:  Positive for environmental allergies. Negative for polydipsia. Bruises/bleeds easily.  Psychiatric/Behavioral:  Positive for depression. Negative for hallucinations, memory loss, substance abuse and suicidal ideas. The patient is nervous/anxious. The patient does not  have insomnia.    All other ROS negative except what is listed above and in the HPI.      Objective:    BP 107/71   Pulse 85   Temp 98.2 F (36.8 C) (Oral)   Ht 5\' 7"  (1.702 m)   Wt 244 lb 9.6 oz (110.9 kg)   SpO2 99%   BMI 38.31 kg/m   Wt Readings from Last 3 Encounters:  12/26/22 244 lb 9.6 oz (110.9 kg)  11/20/21 244 lb (110.7 kg)  09/04/21 239 lb 12.8 oz (108.8 kg)    Physical Exam Vitals and nursing note reviewed.  Constitutional:      General: She is not in acute distress.    Appearance: Normal appearance. She is obese. She is not ill-appearing, toxic-appearing or diaphoretic.  HENT:     Head: Normocephalic and atraumatic.     Right Ear: Tympanic membrane, ear canal and external ear normal. There is no impacted cerumen.     Left Ear: Tympanic membrane, ear canal and external ear normal. There is no impacted cerumen.     Nose: Nose normal. No congestion or rhinorrhea.     Mouth/Throat:     Mouth: Mucous membranes are moist.     Pharynx: Oropharynx is clear. No oropharyngeal exudate or posterior oropharyngeal erythema.  Eyes:     General: No scleral icterus.       Right eye: No discharge.        Left eye: No discharge.     Extraocular Movements: Extraocular movements intact.     Conjunctiva/sclera: Conjunctivae normal.     Pupils: Pupils are equal, round, and reactive to light.  Neck:     Vascular: No carotid bruit.  Cardiovascular:     Rate and Rhythm: Normal rate and regular rhythm.     Pulses: Normal pulses.     Heart sounds: No murmur heard.    No friction rub. No gallop.  Pulmonary:     Effort: Pulmonary effort is normal. No respiratory distress.  Breath sounds: Normal breath sounds. No stridor. No wheezing, rhonchi or rales.  Chest:     Chest wall: No tenderness.  Abdominal:     General: Abdomen is flat. Bowel sounds are normal. There is no distension.     Palpations: Abdomen is soft. There is no mass.     Tenderness: There is no abdominal  tenderness. There is no right CVA tenderness, left CVA tenderness, guarding or rebound.     Hernia: No hernia is present.  Genitourinary:    Comments: Breast and pelvic exams deferred with shared decision making Musculoskeletal:        General: No swelling, tenderness, deformity or signs of injury.     Cervical back: Normal range of motion and neck supple. No rigidity. No muscular tenderness.     Right lower leg: No edema.     Left lower leg: No edema.  Lymphadenopathy:     Cervical: No cervical adenopathy.  Skin:    General: Skin is warm and dry.     Capillary Refill: Capillary refill takes less than 2 seconds.     Coloration: Skin is not jaundiced or pale.     Findings: No bruising, erythema, lesion or rash.  Neurological:     General: No focal deficit present.     Mental Status: She is alert and oriented to person, place, and time. Mental status is at baseline.     Cranial Nerves: No cranial nerve deficit.     Sensory: No sensory deficit.     Motor: No weakness.     Coordination: Coordination normal.     Gait: Gait normal.     Deep Tendon Reflexes: Reflexes normal.  Psychiatric:        Mood and Affect: Mood normal.        Behavior: Behavior normal.        Thought Content: Thought content normal.        Judgment: Judgment normal.     Results for orders placed or performed in visit on 11/20/21  Urinalysis, Routine w reflex microscopic  Result Value Ref Range   Specific Gravity, UA 1.020 1.005 - 1.030   pH, UA 7.5 5.0 - 7.5   Color, UA Yellow Yellow   Appearance Ur Clear Clear   Leukocytes,UA Negative Negative   Protein,UA Trace (A) Negative/Trace   Glucose, UA Negative Negative   Ketones, UA Negative Negative   RBC, UA Negative Negative   Bilirubin, UA Negative Negative   Urobilinogen, Ur 0.2 0.2 - 1.0 mg/dL   Nitrite, UA Negative Negative  Comprehensive metabolic panel  Result Value Ref Range   Glucose 91 70 - 99 mg/dL   BUN 10 6 - 20 mg/dL   Creatinine, Ser 1.61  0.57 - 1.00 mg/dL   eGFR 95 >09 UE/AVW/0.98   BUN/Creatinine Ratio 12 9 - 23   Sodium 139 134 - 144 mmol/L   Potassium 4.3 3.5 - 5.2 mmol/L   Chloride 101 96 - 106 mmol/L   CO2 25 20 - 29 mmol/L   Calcium 9.5 8.7 - 10.2 mg/dL   Total Protein 7.1 6.0 - 8.5 g/dL   Albumin 4.6 3.8 - 4.8 g/dL   Globulin, Total 2.5 1.5 - 4.5 g/dL   Albumin/Globulin Ratio 1.8 1.2 - 2.2   Bilirubin Total 0.4 0.0 - 1.2 mg/dL   Alkaline Phosphatase 67 44 - 121 IU/L   AST 16 0 - 40 IU/L   ALT 18 0 - 32 IU/L  Gamma GT  Result  Value Ref Range   GGT 10 0 - 60 IU/L  CBC with Differential/Platelet  Result Value Ref Range   WBC 7.5 3.4 - 10.8 x10E3/uL   RBC 5.03 3.77 - 5.28 x10E6/uL   Hemoglobin 13.9 11.1 - 15.9 g/dL   Hematocrit 30.8 65.7 - 46.6 %   MCV 85 79 - 97 fL   MCH 27.6 26.6 - 33.0 pg   MCHC 32.6 31.5 - 35.7 g/dL   RDW 84.6 96.2 - 95.2 %   Platelets 277 150 - 450 x10E3/uL   Neutrophils 62 Not Estab. %   Lymphs 28 Not Estab. %   Monocytes 7 Not Estab. %   Eos 2 Not Estab. %   Basos 1 Not Estab. %   Neutrophils Absolute 4.6 1.4 - 7.0 x10E3/uL   Lymphocytes Absolute 2.1 0.7 - 3.1 x10E3/uL   Monocytes Absolute 0.5 0.1 - 0.9 x10E3/uL   EOS (ABSOLUTE) 0.2 0.0 - 0.4 x10E3/uL   Basophils Absolute 0.0 0.0 - 0.2 x10E3/uL   Immature Granulocytes 0 Not Estab. %   Immature Grans (Abs) 0.0 0.0 - 0.1 x10E3/uL      Assessment & Plan:   Problem List Items Addressed This Visit       Cardiovascular and Mediastinum   Migraine    Under good control on current regimen. Continue current regimen. Continue to monitor. Call with any concerns. Refills given.        Relevant Medications   sertraline (ZOLOFT) 50 MG tablet   SUMAtriptan (IMITREX) 100 MG tablet   SUMAtriptan 6 MG/0.5ML SOAJ     Respiratory   Asthma, moderate persistent    Under good control on current regimen. Continue current regimen. Continue to monitor. Call with any concerns. Refills given.        Relevant Medications    fluticasone-salmeterol (WIXELA INHUB) 100-50 MCG/ACT AEPB   levalbuterol (XOPENEX HFA) 45 MCG/ACT inhaler   Tiotropium Bromide Monohydrate (SPIRIVA RESPIMAT) 2.5 MCG/ACT AERS     Other   Acute anxiety    Under good control on current regimen. Continue current regimen. Continue to monitor. Call with any concerns. Refills given.        Relevant Medications   sertraline (ZOLOFT) 50 MG tablet   busPIRone (BUSPAR) 5 MG tablet   Vitamin D deficiency    Rechecking labs today. Await results. Treat as needed.       Relevant Orders   VITAMIN D 25 Hydroxy (Vit-D Deficiency, Fractures)   Other Visit Diagnoses     Routine general medical examination at a health care facility    -  Primary   Vaccines up to date. Screening labs checked today. Pap up to date. Continue diet and exercise. Call with any concerns. Continue to monitor.   Relevant Orders   CBC with Differential/Platelet   Comprehensive metabolic panel   Lipid Panel w/o Chol/HDL Ratio   Urinalysis, Routine w reflex microscopic   TSH   Abnormal menses       Will check labs. Await results. Treat as needed.   Relevant Orders   LH   Testosterone, free, total([facility])   FSH   Estradiol   DHEA-sulfate   HgB A1c   Vaginal discharge       Will check wet prep. Await results.   Relevant Orders   WET PREP FOR TRICH, YEAST, CLUE        Follow up plan: Return in about 6 months (around 06/27/2023).   LABORATORY TESTING:  - Pap smear: up to date  IMMUNIZATIONS:   - Tdap: Tetanus vaccination status reviewed: last tetanus booster within 10 years. - Influenza: Postponed to flu season - Pneumovax: Not applicable - Prevnar: Not applicable - COVID: Refused - HPV: Not applicable  PATIENT COUNSELING:   Advised to take 1 mg of folate supplement per day if capable of pregnancy.   Sexuality: Discussed sexually transmitted diseases, partner selection, use of condoms, avoidance of unintended pregnancy  and contraceptive  alternatives.   Advised to avoid cigarette smoking.  I discussed with the patient that most people either abstain from alcohol or drink within safe limits (<=14/week and <=4 drinks/occasion for males, <=7/weeks and <= 3 drinks/occasion for females) and that the risk for alcohol disorders and other health effects rises proportionally with the number of drinks per week and how often a drinker exceeds daily limits.  Discussed cessation/primary prevention of drug use and availability of treatment for abuse.   Diet: Encouraged to adjust caloric intake to maintain  or achieve ideal body weight, to reduce intake of dietary saturated fat and total fat, to limit sodium intake by avoiding high sodium foods and not adding table salt, and to maintain adequate dietary potassium and calcium preferably from fresh fruits, vegetables, and low-fat dairy products.    stressed the importance of regular exercise  Injury prevention: Discussed safety belts, safety helmets, smoke detector, smoking near bedding or upholstery.   Dental health: Discussed importance of regular tooth brushing, flossing, and dental visits.    NEXT PREVENTATIVE PHYSICAL DUE IN 1 YEAR. Return in about 6 months (around 06/27/2023).

## 2022-12-26 NOTE — Assessment & Plan Note (Signed)
Under good control on current regimen. Continue current regimen. Continue to monitor. Call with any concerns. Refills given.   

## 2022-12-26 NOTE — Assessment & Plan Note (Signed)
Rechecking labs today. Await results. Treat as needed.  °

## 2022-12-27 LAB — COMPREHENSIVE METABOLIC PANEL
ALT: 11 IU/L (ref 0–32)
AST: 15 IU/L (ref 0–40)
Albumin: 3.5 g/dL — ABNORMAL LOW (ref 3.9–4.9)
Alkaline Phosphatase: 57 IU/L (ref 44–121)
BUN/Creatinine Ratio: 17 (ref 9–23)
BUN: 11 mg/dL (ref 6–20)
Bilirubin Total: <0.2 mg/dL (ref 0.0–1.2)
CO2: 23 mmol/L (ref 20–29)
Calcium: 8.9 mg/dL (ref 8.7–10.2)
Chloride: 106 mmol/L (ref 96–106)
Creatinine, Ser: 0.66 mg/dL (ref 0.57–1.00)
Globulin, Total: 1.5 g/dL (ref 1.5–4.5)
Glucose: 94 mg/dL (ref 70–99)
Potassium: 4 mmol/L (ref 3.5–5.2)
Sodium: 140 mmol/L (ref 134–144)
Total Protein: 5 g/dL — ABNORMAL LOW (ref 6.0–8.5)
eGFR: 119 mL/min/{1.73_m2} (ref >59)

## 2022-12-27 LAB — CBC WITH DIFFERENTIAL/PLATELET
Basophils Absolute: 0 10*3/uL (ref 0.0–0.2)
Basos: 1 %
EOS (ABSOLUTE): 0.1 10*3/uL (ref 0.0–0.4)
Eos: 2 %
Hematocrit: 40.1 % (ref 34.0–46.6)
Hemoglobin: 13.3 g/dL (ref 11.1–15.9)
Immature Grans (Abs): 0 10*3/uL (ref 0.0–0.1)
Immature Granulocytes: 0 %
Lymphocytes Absolute: 2.4 10*3/uL (ref 0.7–3.1)
Lymphs: 32 %
MCH: 28.2 pg (ref 26.6–33.0)
MCHC: 33.2 g/dL (ref 31.5–35.7)
MCV: 85 fL (ref 79–97)
Monocytes Absolute: 0.4 10*3/uL (ref 0.1–0.9)
Monocytes: 5 %
Neutrophils Absolute: 4.4 10*3/uL (ref 1.4–7.0)
Neutrophils: 60 %
Platelets: 266 10*3/uL (ref 150–450)
RBC: 4.71 x10E6/uL (ref 3.77–5.28)
RDW: 13.2 % (ref 11.7–15.4)
WBC: 7.3 10*3/uL (ref 3.4–10.8)

## 2022-12-27 LAB — URINALYSIS, ROUTINE W REFLEX MICROSCOPIC
Bilirubin, UA: NEGATIVE
Glucose, UA: NEGATIVE
Ketones, UA: NEGATIVE
Nitrite, UA: NEGATIVE
Protein,UA: NEGATIVE
Specific Gravity, UA: 1.015 (ref 1.005–1.030)
Urobilinogen, Ur: 0.2 mg/dL (ref 0.2–1.0)
pH, UA: 7 (ref 5.0–7.5)

## 2022-12-27 LAB — HEMOGLOBIN A1C: Hgb A1c MFr Bld: 5.3 % (ref 4.8–5.6)

## 2022-12-27 LAB — LIPID PANEL W/O CHOL/HDL RATIO
Cholesterol, Total: 163 mg/dL (ref 100–199)
HDL: 29 mg/dL — ABNORMAL LOW (ref >39)
LDL Chol Calc (NIH): 97 mg/dL (ref 0–99)
Triglycerides: 212 mg/dL — ABNORMAL HIGH (ref 0–149)
VLDL Cholesterol Cal: 37 mg/dL (ref 5–40)

## 2022-12-27 LAB — WET PREP FOR TRICH, YEAST, CLUE
Clue Cell Exam: POSITIVE — AB
Trichomonas Exam: NEGATIVE
Yeast Exam: NEGATIVE

## 2022-12-27 LAB — TSH: TSH: 2.2 u[IU]/mL (ref 0.450–4.500)

## 2022-12-27 LAB — MICROSCOPIC EXAMINATION
Bacteria, UA: NONE SEEN
Epithelial Cells (non renal): NONE SEEN /hpf (ref 0–10)

## 2022-12-29 ENCOUNTER — Other Ambulatory Visit: Payer: Self-pay | Admitting: Family Medicine

## 2022-12-29 ENCOUNTER — Encounter: Payer: Self-pay | Admitting: Family Medicine

## 2022-12-29 MED ORDER — VITAMIN D (ERGOCALCIFEROL) 1.25 MG (50000 UNIT) PO CAPS: 50000.0000 [IU] | ORAL_CAPSULE | ORAL | 1 refills | Status: DC

## 2022-12-30 LAB — FOLLICLE STIMULATING HORMONE: FSH: 7.1 m[IU]/mL

## 2022-12-30 LAB — LUTEINIZING HORMONE: LH: 10.6 m[IU]/mL

## 2022-12-30 LAB — TESTOSTERONE, FREE, TOTAL, SHBG
Sex Hormone Binding: 66.5 nmol/L (ref 24.6–122.0)
Testosterone, Free: 0.6 pg/mL (ref 0.0–4.2)
Testosterone: 15 ng/dL (ref 8–60)

## 2022-12-30 LAB — HEMOGLOBIN A1C: Est. average glucose Bld gHb Est-mCnc: 105 mg/dL

## 2022-12-30 LAB — VITAMIN D 25 HYDROXY (VIT D DEFICIENCY, FRACTURES): Vit D, 25-Hydroxy: 17.7 ng/mL — ABNORMAL LOW (ref 30.0–100.0)

## 2022-12-30 LAB — ESTRADIOL: Estradiol: 41.8 pg/mL

## 2022-12-30 LAB — DHEA-SULFATE: DHEA-SO4: 109 ug/dL (ref 84.8–378.0)

## 2023-05-26 ENCOUNTER — Encounter: Payer: Self-pay | Admitting: Family Medicine

## 2023-06-23 ENCOUNTER — Ambulatory Visit: Payer: 59 | Admitting: Family Medicine

## 2023-06-23 ENCOUNTER — Encounter: Payer: Self-pay | Admitting: Family Medicine

## 2023-06-23 VITALS — BP 104/72 | HR 109 | Wt 234.0 lb

## 2023-06-23 DIAGNOSIS — J4540 Moderate persistent asthma, uncomplicated: Secondary | ICD-10-CM | POA: Diagnosis not present

## 2023-06-23 DIAGNOSIS — J189 Pneumonia, unspecified organism: Secondary | ICD-10-CM

## 2023-06-23 DIAGNOSIS — R1084 Generalized abdominal pain: Secondary | ICD-10-CM | POA: Diagnosis not present

## 2023-06-23 DIAGNOSIS — E559 Vitamin D deficiency, unspecified: Secondary | ICD-10-CM | POA: Diagnosis not present

## 2023-06-23 DIAGNOSIS — F419 Anxiety disorder, unspecified: Secondary | ICD-10-CM

## 2023-06-23 MED ORDER — VITAMIN D (ERGOCALCIFEROL) 1.25 MG (50000 UNIT) PO CAPS: 50000.0000 [IU] | ORAL_CAPSULE | ORAL | 1 refills | Status: DC

## 2023-06-23 MED ORDER — SUMATRIPTAN SUCCINATE 100 MG PO TABS: 100.0000 mg | ORAL_TABLET | ORAL | 12 refills | Status: DC | PRN

## 2023-06-23 MED ORDER — SPIRIVA RESPIMAT 2.5 MCG/ACT IN AERS: INHALATION_SPRAY | RESPIRATORY_TRACT | 1 refills | Status: DC

## 2023-06-23 MED ORDER — PREDNISONE 50 MG PO TABS: 50.0000 mg | ORAL_TABLET | Freq: Every day | ORAL | 0 refills | Status: DC

## 2023-06-23 MED ORDER — FLUTICASONE-SALMETEROL 100-50 MCG/ACT IN AEPB: 1.0000 | INHALATION_SPRAY | Freq: Two times a day (BID) | RESPIRATORY_TRACT | 3 refills | Status: DC

## 2023-06-23 MED ORDER — SUMATRIPTAN SUCCINATE 6 MG/0.5ML ~~LOC~~ SOAJ: 6.0000 mg | Freq: Every day | SUBCUTANEOUS | 12 refills | Status: DC | PRN

## 2023-06-23 MED ORDER — SERTRALINE HCL 50 MG PO TABS: 50.0000 mg | ORAL_TABLET | Freq: Every day | ORAL | 1 refills | Status: DC

## 2023-06-23 MED ORDER — AZITHROMYCIN 250 MG PO TABS: ORAL_TABLET | ORAL | 0 refills | Status: AC

## 2023-06-23 MED ORDER — BUSPIRONE HCL 5 MG PO TABS: ORAL_TABLET | ORAL | 1 refills | Status: DC

## 2023-06-23 MED ORDER — LEVALBUTEROL TARTRATE 45 MCG/ACT IN AERO
INHALATION_SPRAY | RESPIRATORY_TRACT | 6 refills | Status: DC
Start: 2023-06-23 — End: 2024-03-30

## 2023-06-23 MED ORDER — KETOROLAC TROMETHAMINE 10 MG PO TABS: 10.0000 mg | ORAL_TABLET | ORAL | 3 refills | Status: AC | PRN

## 2023-06-23 NOTE — Assessment & Plan Note (Signed)
Acting up with illness. Will continue her inhalers. Refills given. Call with any concerns.

## 2023-06-23 NOTE — Assessment & Plan Note (Signed)
Rechecking labs today. Await results. Treat as needed.  °

## 2023-06-23 NOTE — Assessment & Plan Note (Signed)
Under good control on current regimen. Continue current regimen. Continue to monitor. Call with any concerns. Refills given.   

## 2023-06-23 NOTE — Progress Notes (Signed)
BP 104/72   Pulse (!) 109   Wt 234 lb (106.1 kg)   SpO2 98%   BMI 36.65 kg/m    Subjective:    Patient ID: Miranda Mcdonald, female    DOB: 03-30-1991, 32 y.o.   MRN: [id number]  HPI: Miranda Mcdonald is a 32 y.o. female  Chief Complaint  Patient presents with   Anxiety   Cough    Patient says she is concerned about a cough she has had for the past two weeks, but say here recently it has gotten worse where it is deep into her chest with wheezing. Patient says she has tried Aleve-D. Patient denies coughing up an phlegm. Patient says she tried her Inhaler and she says she is able to talk without coughing. Patient says her husband and daughter have been sick with similar symptoms.    UPPER RESPIRATORY TRACT INFECTION Duration: started yesterday Worst symptom: cough Fever: yes Cough: yes Shortness of breath: yes Wheezing: yes Chest pain: yes, with cough Chest tightness: yes Chest congestion: yes Nasal congestion: no Runny nose: no Post nasal drip: no Sneezing: no Sore throat: no Swollen glands: no Sinus pressure: no Headache: no Face pain: no Toothache: no Ear pain: no  Ear pressure: no  Eyes red/itching:no Eye drainage/crusting: no  Vomiting: no Rash: no Fatigue: no Sick contacts: yes Strep contacts: no  Context: worse Recurrent sinusitis: no Relief with OTC cold/cough medications: no  Treatments attempted: inhalers   ANXIETY/DEPRESSION- was pushed to her limit about a month ago when her daughter told her that she was suicidal and her sister drank hydrogen peroxide and ended up in the ER right after her grandfather died. She has been hanging in there, but she has been barely hanging in there. Doing better now. Does not feel like she needs to change her medicine at this time.  Duration: chronic Status:exacerbated Anxious mood: yes  Excessive worrying: yes Irritability: yes  Sweating: no Nausea: no Palpitations:no Hyperventilation: no Panic attacks:  no Agoraphobia: no  Obscessions/compulsions: no Depressed mood: yes    06/23/2023   10:46 AM 12/26/2022    4:55 PM 11/20/2021   11:20 AM 09/04/2021   11:20 AM 07/12/2021    3:38 PM  Depression screen PHQ 2/9  Decreased Interest 0 0 1 1 1   Down, Depressed, Hopeless 0 0 0 1 1  PHQ - 2 Score 0 0 1 2 2   Altered sleeping 1 1 1 1 3   Tired, decreased energy 0 2 1 1 1   Change in appetite 0 0 0 0 0  Feeling bad or failure about yourself  0 0 0 0 0  Trouble concentrating 2 1 1 2 1   Moving slowly or fidgety/restless 0 0 0 1 0  Suicidal thoughts 0 0 0 0 0  PHQ-9 Score 3 4 4 7 7   Difficult doing work/chores Not difficult at all Not difficult at all  Somewhat difficult       06/23/2023   10:46 AM 12/26/2022    4:55 PM 11/20/2021   11:20 AM 09/04/2021   11:20 AM  GAD 7 : Generalized Anxiety Score  Nervous, Anxious, on Edge 2 2 2 1   Control/stop worrying 1 1 1  0  Worry too much - different things 0 1 1 1   Trouble relaxing 1 2 1 2   Restless 1 0 0 1  Easily annoyed or irritable 1 2 1 1   Afraid - awful might happen 0 0 1 1  Total GAD 7 Score 6 8  7 7  Anxiety Difficulty Somewhat difficult Somewhat difficult Somewhat difficult Somewhat difficult   Anhedonia: no Weight changes: no Insomnia: yes hard to fall asleep  Hypersomnia: no Fatigue/loss of energy: yes Feelings of worthlessness: no Feelings of guilt: no Impaired concentration/indecisiveness: yes Suicidal ideations: no  Crying spells: yes Recent Stressors/Life Changes: yes   Relationship problems: no   Family stress: yes     Financial stress: no    Job stress: yes    Recent death/loss: yes  ASTHMA Asthma status: stable Satisfied with current treatment?: yes Albuterol/rescue inhaler frequency: often with illness Dyspnea frequency:  often with illness Wheezing frequency: often with illness Cough frequency: often with illness Nocturnal symptom frequency: none Limitation of activity: yes Current upper respiratory symptoms:  yes Aerochamber/spacer use: no Visits to ER or Urgent Care in past year: no Pneumovax: Up to Date Influenza: Not up to Date   Relevant past medical, surgical, family and social history reviewed and updated as indicated. Interim medical history since our last visit reviewed. Allergies and medications reviewed and updated.  Review of Systems  Constitutional: Negative.   HENT: Negative.    Respiratory:  Positive for cough, chest tightness, shortness of breath and wheezing. Negative for apnea, choking and stridor.   Cardiovascular: Negative.   Gastrointestinal: Negative.   Musculoskeletal: Negative.   Neurological: Negative.   Psychiatric/Behavioral: Negative.      Per HPI unless specifically indicated above     Objective:    BP 104/72   Pulse (!) 109   Wt 234 lb (106.1 kg)   SpO2 98%   BMI 36.65 kg/m   Wt Readings from Last 3 Encounters:  06/23/23 234 lb (106.1 kg)  12/26/22 244 lb 9.6 oz (110.9 kg)  11/20/21 244 lb (110.7 kg)    Physical Exam Vitals and nursing note reviewed.  Constitutional:      General: She is not in acute distress.    Appearance: Normal appearance. She is not ill-appearing, toxic-appearing or diaphoretic.  HENT:     Head: Normocephalic and atraumatic.     Right Ear: External ear normal.     Left Ear: External ear normal.     Nose: Nose normal.     Mouth/Throat:     Mouth: Mucous membranes are moist.     Pharynx: Oropharynx is clear.  Eyes:     General: No scleral icterus.       Right eye: No discharge.        Left eye: No discharge.     Extraocular Movements: Extraocular movements intact.     Conjunctiva/sclera: Conjunctivae normal.     Pupils: Pupils are equal, round, and reactive to light.  Cardiovascular:     Rate and Rhythm: Normal rate and regular rhythm.     Pulses: Normal pulses.     Heart sounds: Normal heart sounds. No murmur heard.    No friction rub. No gallop.  Pulmonary:     Effort: Pulmonary effort is normal. No  respiratory distress.     Breath sounds: No stridor. Rhonchi (fine rhonchi throughout) present. No wheezing or rales.  Chest:     Chest wall: No tenderness.  Musculoskeletal:        General: Normal range of motion.     Cervical back: Normal range of motion and neck supple.  Skin:    General: Skin is warm and dry.     Capillary Refill: Capillary refill takes less than 2 seconds.     Coloration: Skin is not jaundiced  or pale.     Findings: No bruising, erythema, lesion or rash.  Neurological:     General: No focal deficit present.     Mental Status: She is alert and oriented to person, place, and time. Mental status is at baseline.  Psychiatric:        Mood and Affect: Mood normal.        Behavior: Behavior normal.        Thought Content: Thought content normal.        Judgment: Judgment normal.     Results for orders placed or performed in visit on 12/26/22  Microscopic Examination   Collection Time: 12/26/22  4:08 PM   Urine  Result Value Ref Range   WBC, UA 0-5 0 - 5 /hpf   RBC, Urine 3-10 (A) 0 - 2 /hpf   Epithelial Cells (non renal) None seen 0 - 10 /hpf   Bacteria, UA None seen None seen/Few  Urinalysis, Routine w reflex microscopic   Collection Time: 12/26/22  4:08 PM  Result Value Ref Range   Specific Gravity, UA 1.015 1.005 - 1.030   pH, UA 7.0 5.0 - 7.5   Color, UA Yellow Yellow   Appearance Ur Cloudy (A) Clear   Leukocytes,UA Trace (A) Negative   Protein,UA Negative Negative/Trace   Glucose, UA Negative Negative   Ketones, UA Negative Negative   RBC, UA 3+ (A) Negative   Bilirubin, UA Negative Negative   Urobilinogen, Ur 0.2 0.2 - 1.0 mg/dL   Nitrite, UA Negative Negative   Microscopic Examination See below:   CBC with Differential/Platelet   Collection Time: 12/26/22  4:11 PM  Result Value Ref Range   WBC 7.3 3.4 - 10.8 x10E3/uL   RBC 4.71 3.77 - 5.28 x10E6/uL   Hemoglobin 13.3 11.1 - 15.9 g/dL   Hematocrit 55.7 32.2 - 46.6 %   MCV 85 79 - 97 fL   MCH  28.2 26.6 - 33.0 pg   MCHC 33.2 31.5 - 35.7 g/dL   RDW 02.5 42.7 - 06.2 %   Platelets 266 150 - 450 x10E3/uL   Neutrophils 60 Not Estab. %   Lymphs 32 Not Estab. %   Monocytes 5 Not Estab. %   Eos 2 Not Estab. %   Basos 1 Not Estab. %   Neutrophils Absolute 4.4 1.4 - 7.0 x10E3/uL   Lymphocytes Absolute 2.4 0.7 - 3.1 x10E3/uL   Monocytes Absolute 0.4 0.1 - 0.9 x10E3/uL   EOS (ABSOLUTE) 0.1 0.0 - 0.4 x10E3/uL   Basophils Absolute 0.0 0.0 - 0.2 x10E3/uL   Immature Granulocytes 0 Not Estab. %   Immature Grans (Abs) 0.0 0.0 - 0.1 x10E3/uL  Comprehensive metabolic panel   Collection Time: 12/26/22  4:11 PM  Result Value Ref Range   Glucose 94 70 - 99 mg/dL   BUN 11 6 - 20 mg/dL   Creatinine, Ser 3.76 0.57 - 1.00 mg/dL   eGFR 283 >15 VV/OHY/0.73   BUN/Creatinine Ratio 17 9 - 23   Sodium 140 134 - 144 mmol/L   Potassium 4.0 3.5 - 5.2 mmol/L   Chloride 106 96 - 106 mmol/L   CO2 23 20 - 29 mmol/L   Calcium 8.9 8.7 - 10.2 mg/dL   Total Protein 5.0 (L) 6.0 - 8.5 g/dL   Albumin 3.5 (L) 3.9 - 4.9 g/dL   Globulin, Total 1.5 1.5 - 4.5 g/dL   Bilirubin Total <7.1 0.0 - 1.2 mg/dL   Alkaline Phosphatase 57 44 - 121 IU/L  AST 15 0 - 40 IU/L   ALT 11 0 - 32 IU/L  Lipid Panel w/o Chol/HDL Ratio   Collection Time: 12/26/22  4:11 PM  Result Value Ref Range   Cholesterol, Total 163 100 - 199 mg/dL   Triglycerides 355 (H) 0 - 149 mg/dL   HDL 29 (L) >73 mg/dL   VLDL Cholesterol Cal 37 5 - 40 mg/dL   LDL Chol Calc (NIH) 97 0 - 99 mg/dL  TSH   Collection Time: 12/26/22  4:11 PM  Result Value Ref Range   TSH 2.200 0.450 - 4.500 uIU/mL  LH   Collection Time: 12/26/22  4:41 PM  Result Value Ref Range   LH 10.6 mIU/mL  Testosterone, free, total([facility])   Collection Time: 12/26/22  4:41 PM  Result Value Ref Range   Testosterone 15 8 - 60 ng/dL   Testosterone, Free 0.6 0.0 - 4.2 pg/mL   Sex Hormone Binding 66.5 24.6 - 122.0 nmol/L  [facility]   Collection Time: 12/26/22  4:41 PM  Result  Value Ref Range   FSH 7.1 mIU/mL  Estradiol   Collection Time: 12/26/22  4:41 PM  Result Value Ref Range   Estradiol 41.8 pg/mL  DHEA-sulfate   Collection Time: 12/26/22  4:41 PM  Result Value Ref Range   DHEA-SO4 109.0 84.8 - 378.0 ug/dL  VITAMIN D 25 Hydroxy (Vit-D Deficiency, Fractures)   Collection Time: 12/26/22  4:41 PM  Result Value Ref Range   Vit D, 25-Hydroxy 17.7 (L) 30.0 - 100.0 ng/mL  HgB A1c   Collection Time: 12/26/22  4:41 PM  Result Value Ref Range   Hgb A1c MFr Bld 5.3 4.8 - 5.6 %   Est. average glucose Bld gHb Est-mCnc 105 mg/dL  WET PREP FOR TRICH, YEAST, CLUE   Collection Time: 12/26/22  4:58 PM   Specimen: Vaginal Swab   Vaginal Swab  Result Value Ref Range   Trichomonas Exam Negative Negative   Yeast Exam Negative Negative   Clue Cell Exam Positive (A) Negative      Assessment & Plan:   Problem List Items Addressed This Visit       Respiratory   Asthma, moderate persistent   Acting up with illness. Will continue her inhalers. Refills given. Call with any concerns.      Relevant Medications   predniSONE (DELTASONE) 50 MG tablet   fluticasone-salmeterol (WIXELA INHUB) 100-50 MCG/ACT AEPB   levalbuterol (XOPENEX HFA) 45 MCG/ACT inhaler   Tiotropium Bromide Monohydrate (SPIRIVA RESPIMAT) 2.5 MCG/ACT AERS     Other   Acute anxiety   Under good control on current regimen. Continue current regimen. Continue to monitor. Call with any concerns. Refills given.        Relevant Medications   busPIRone (BUSPAR) 5 MG tablet   sertraline (ZOLOFT) 50 MG tablet   Vitamin D deficiency   Rechecking labs today. Await results. Treat as needed.       Relevant Medications   Vitamin D, Ergocalciferol, (DRISDOL) 1.25 MG (50000 UNIT) CAPS capsule   Other Relevant Orders   VITAMIN D 25 Hydroxy (Vit-D Deficiency, Fractures)   Other Visit Diagnoses       Atypical pneumonia    -  Primary   Will treat with azithromycin and prednisone. Call with any concerns  or if not getting better. Continue to monitor.   Relevant Medications   azithromycin (ZITHROMAX) 250 MG tablet   fluticasone-salmeterol (WIXELA INHUB) 100-50 MCG/ACT AEPB   levalbuterol (XOPENEX HFA) 45 MCG/ACT inhaler  Tiotropium Bromide Monohydrate (SPIRIVA RESPIMAT) 2.5 MCG/ACT AERS     Generalized abdominal pain       Will check celiac panel. Await results.   Relevant Orders   Gliadin antibodies, serum   Tissue transglutaminase, IgA   Reticulin Antibody, IgA w reflex titer        Follow up plan: Return in about 6 weeks (around 08/04/2023).

## 2023-06-25 LAB — VITAMIN D 25 HYDROXY (VIT D DEFICIENCY, FRACTURES): Vit D, 25-Hydroxy: 30.2 ng/mL (ref 30.0–100.0)

## 2023-06-25 LAB — GLIADIN ANTIBODIES, SERUM
Antigliadin Abs, IgA: 3 U (ref 0–19)
Gliadin IgG: 2 U (ref 0–19)

## 2023-06-25 LAB — TISSUE TRANSGLUTAMINASE, IGA

## 2023-06-25 LAB — RETICULIN ANTIBODIES, IGA W TITER

## 2023-06-29 ENCOUNTER — Ambulatory Visit: Payer: 59 | Admitting: Family Medicine

## 2023-08-04 ENCOUNTER — Ambulatory Visit: Payer: 59 | Admitting: Family Medicine

## 2023-11-28 ENCOUNTER — Other Ambulatory Visit: Payer: Self-pay | Admitting: Family Medicine

## 2023-12-02 NOTE — Telephone Encounter (Signed)
 OFFICE VISIT NEEDED FOR ADDITIONAL REFILLS.  Requested Prescriptions  Pending Prescriptions Disp Refills   busPIRone  (BUSPAR ) 5 MG tablet [Pharmacy Med Name: busPIRone  HCl 5 MG Oral Tablet] 90 tablet 0    Sig: TAKE 1 TO 3 TABLETS BY MOUTH THREE TIMES DAILY AS NEEDED     Psychiatry: Anxiolytics/Hypnotics - Non-controlled Failed - 12/02/2023  9:28 AM      Failed - Valid encounter within last 12 months    Recent Outpatient Visits   None

## 2024-01-01 ENCOUNTER — Other Ambulatory Visit: Payer: Self-pay | Admitting: Family Medicine

## 2024-01-04 NOTE — Telephone Encounter (Signed)
 OFFICE VISIT NEEDED FOR ADDITIONAL REFILLS.  Requested Prescriptions  Pending Prescriptions Disp Refills   sertraline  (ZOLOFT ) 50 MG tablet [Pharmacy Med Name: Sertraline  HCl 50 MG Oral Tablet] 30 tablet 0    Sig: Take 1 tablet by mouth once daily     Psychiatry:  Antidepressants - SSRI - sertraline  Failed - 01/04/2024  8:52 AM      Failed - AST in normal range and within 360 days    AST  Date Value Ref Range Status  12/26/2022 15 0 - 40 IU/L Final         Failed - ALT in normal range and within 360 days    ALT  Date Value Ref Range Status  12/26/2022 11 0 - 32 IU/L Final         Failed - Valid encounter within last 6 months    Recent Outpatient Visits   None            Passed - Completed PHQ-2 or PHQ-9 in the last 360 days

## 2024-01-31 ENCOUNTER — Other Ambulatory Visit: Payer: Self-pay | Admitting: Family Medicine

## 2024-02-01 NOTE — Telephone Encounter (Signed)
 Requested medications are due for refill today.  yes  Requested medications are on the active medications list.  yes  Last refill. Sertraline  01/04/2024 #30 0 rf, Buspar  12/02/2023 #90 0 rf  Future visit scheduled.   no  Notes to clinic.  Labs are expired. Pt already given a courtesy refill.    Requested Prescriptions  Pending Prescriptions Disp Refills   sertraline  (ZOLOFT ) 50 MG tablet [Pharmacy Med Name: Sertraline  HCl 50 MG Oral Tablet] 30 tablet 0    Sig: Take 1 tablet by mouth once daily     Psychiatry:  Antidepressants - SSRI - sertraline  Failed - 02/01/2024  4:30 PM      Failed - AST in normal range and within 360 days    AST  Date Value Ref Range Status  12/26/2022 15 0 - 40 IU/L Final         Failed - ALT in normal range and within 360 days    ALT  Date Value Ref Range Status  12/26/2022 11 0 - 32 IU/L Final         Failed - Valid encounter within last 6 months    Recent Outpatient Visits   None            Passed - Completed PHQ-2 or PHQ-9 in the last 360 days       busPIRone  (BUSPAR ) 5 MG tablet [Pharmacy Med Name: busPIRone  HCl 5 MG Oral Tablet] 90 tablet 0    Sig: TAKE 1 TO 3 TABLETS BY MOUTH THREE TIMES DAILY AS NEEDED     Psychiatry: Anxiolytics/Hypnotics - Non-controlled Failed - 02/01/2024  4:30 PM      Failed - Valid encounter within last 12 months    Recent Outpatient Visits   None

## 2024-02-04 NOTE — Telephone Encounter (Signed)
 Appt scheduled 03-29-24

## 2024-03-29 ENCOUNTER — Ambulatory Visit: Admitting: Family Medicine

## 2024-03-30 ENCOUNTER — Encounter: Payer: Self-pay | Admitting: Family Medicine

## 2024-03-30 ENCOUNTER — Ambulatory Visit (INDEPENDENT_AMBULATORY_CARE_PROVIDER_SITE_OTHER): Payer: Self-pay | Admitting: Family Medicine

## 2024-03-30 VITALS — BP 107/73 | HR 73 | Temp 98.3°F | Ht 68.5 in | Wt 219.8 lb

## 2024-03-30 DIAGNOSIS — F419 Anxiety disorder, unspecified: Secondary | ICD-10-CM

## 2024-03-30 DIAGNOSIS — J4540 Moderate persistent asthma, uncomplicated: Secondary | ICD-10-CM

## 2024-03-30 DIAGNOSIS — G43109 Migraine with aura, not intractable, without status migrainosus: Secondary | ICD-10-CM

## 2024-03-30 MED ORDER — SUMATRIPTAN SUCCINATE 6 MG/0.5ML ~~LOC~~ SOAJ: 6.0000 mg | Freq: Every day | SUBCUTANEOUS | 12 refills | Status: AC | PRN

## 2024-03-30 MED ORDER — FLUCONAZOLE 150 MG PO TABS: 150.0000 mg | ORAL_TABLET | Freq: Every day | ORAL | 0 refills | Status: AC

## 2024-03-30 MED ORDER — SPIRIVA RESPIMAT 2.5 MCG/ACT IN AERS: INHALATION_SPRAY | RESPIRATORY_TRACT | 6 refills | Status: AC

## 2024-03-30 MED ORDER — SUMATRIPTAN SUCCINATE 100 MG PO TABS: 100.0000 mg | ORAL_TABLET | ORAL | 12 refills | Status: AC | PRN

## 2024-03-30 MED ORDER — METRONIDAZOLE 500 MG PO TABS: 500.0000 mg | ORAL_TABLET | Freq: Two times a day (BID) | ORAL | 0 refills | Status: AC

## 2024-03-30 MED ORDER — BUSPIRONE HCL 5 MG PO TABS: ORAL_TABLET | ORAL | 1 refills | Status: DC

## 2024-03-30 MED ORDER — SERTRALINE HCL 100 MG PO TABS: 100.0000 mg | ORAL_TABLET | Freq: Every day | ORAL | 0 refills | Status: DC

## 2024-03-30 MED ORDER — FLUTICASONE-SALMETEROL 100-50 MCG/ACT IN AEPB: 1.0000 | INHALATION_SPRAY | Freq: Two times a day (BID) | RESPIRATORY_TRACT | 6 refills | Status: AC

## 2024-03-30 MED ORDER — PROMETHAZINE HCL 25 MG PO TABS: 25.0000 mg | ORAL_TABLET | Freq: Three times a day (TID) | ORAL | 6 refills | Status: AC | PRN

## 2024-03-30 MED ORDER — LEVALBUTEROL TARTRATE 45 MCG/ACT IN AERO: INHALATION_SPRAY | RESPIRATORY_TRACT | 6 refills | Status: AC

## 2024-03-30 NOTE — Progress Notes (Signed)
 BP 107/73   Pulse 73   Temp 98.3 F (36.8 C) (Oral)   Ht 5' 8.5 (1.74 m)   Wt 219 lb 12.8 oz (99.7 kg)   SpO2 98%   BMI 32.93 kg/m    Subjective:    Patient ID: Miranda Mcdonald, female    DOB: 04-23-1991, 33 y.o.   MRN: [id number]  HPI: Miranda Mcdonald is a 33 y.o. female  Chief Complaint  Patient presents with   Anxiety   Asthma   Migraine   Migraines have been stable. Medicine still working. Having migraines about 1-2x a month. Asthma has been stable. Inhalers still working well.   ANXIETY/STRESS Duration: chronic Status:exacerbated Anxious mood: yes  Excessive worrying: yes Irritability: yes  Sweating: no Nausea: no Palpitations:no Hyperventilation: no Panic attacks: no Agoraphobia: no  Obscessions/compulsions: no Depressed mood: yes    03/30/2024    4:13 PM 06/23/2023   10:46 AM 12/26/2022    4:55 PM 11/20/2021   11:20 AM 09/04/2021   11:20 AM  Depression screen PHQ 2/9  Decreased Interest 1 0 0 1 1  Down, Depressed, Hopeless 0 0 0 0 1  PHQ - 2 Score 1 0 0 1 2  Altered sleeping 2 1 1 1 1   Tired, decreased energy 1 0 2 1 1   Change in appetite 0 0 0 0 0  Feeling bad or failure about yourself  0 0 0 0 0  Trouble concentrating 1 2 1 1 2   Moving slowly or fidgety/restless 0 0 0 0 1  Suicidal thoughts 0 0 0 0 0  PHQ-9 Score 5 3 4 4 7   Difficult doing work/chores Somewhat difficult Not difficult at all Not difficult at all  Somewhat difficult   Anhedonia: no Weight changes: no Insomnia: no   Hypersomnia: no Fatigue/loss of energy: yes Feelings of worthlessness: no Feelings of guilt: no Impaired concentration/indecisiveness: no Suicidal ideations: no  Crying spells: no Recent Stressors/Life Changes: yes   Relationship problems: no   Family stress: yes     Financial stress: no    Job stress: yes    Recent death/loss: no   Relevant past medical, surgical, family and social history reviewed and updated as indicated. Interim medical history since our last  visit reviewed. Allergies and medications reviewed and updated.  Review of Systems  Constitutional: Negative.   Respiratory: Negative.    Cardiovascular: Negative.   Musculoskeletal: Negative.   Skin: Negative.   Neurological: Negative.   Psychiatric/Behavioral: Negative.      Per HPI unless specifically indicated above     Objective:    BP 107/73   Pulse 73   Temp 98.3 F (36.8 C) (Oral)   Ht 5' 8.5 (1.74 m)   Wt 219 lb 12.8 oz (99.7 kg)   SpO2 98%   BMI 32.93 kg/m   Wt Readings from Last 3 Encounters:  03/30/24 219 lb 12.8 oz (99.7 kg)  06/23/23 234 lb (106.1 kg)  12/26/22 244 lb 9.6 oz (110.9 kg)    Physical Exam Vitals and nursing note reviewed.  Constitutional:      General: She is not in acute distress.    Appearance: Normal appearance. She is not ill-appearing, toxic-appearing or diaphoretic.  HENT:     Head: Normocephalic and atraumatic.     Right Ear: External ear normal.     Left Ear: External ear normal.     Nose: Nose normal.     Mouth/Throat:     Mouth: Mucous membranes are  moist.     Pharynx: Oropharynx is clear.  Eyes:     General: No scleral icterus.       Right eye: No discharge.        Left eye: No discharge.     Extraocular Movements: Extraocular movements intact.     Conjunctiva/sclera: Conjunctivae normal.     Pupils: Pupils are equal, round, and reactive to light.  Cardiovascular:     Rate and Rhythm: Normal rate and regular rhythm.     Pulses: Normal pulses.     Heart sounds: Normal heart sounds. No murmur heard.    No friction rub. No gallop.  Pulmonary:     Effort: Pulmonary effort is normal. No respiratory distress.     Breath sounds: Normal breath sounds. No stridor. No wheezing, rhonchi or rales.  Chest:     Chest wall: No tenderness.  Musculoskeletal:        General: Normal range of motion.     Cervical back: Normal range of motion and neck supple.  Skin:    General: Skin is warm and dry.     Capillary Refill: Capillary  refill takes less than 2 seconds.     Coloration: Skin is not jaundiced or pale.     Findings: No bruising, erythema, lesion or rash.  Neurological:     General: No focal deficit present.     Mental Status: She is alert and oriented to person, place, and time. Mental status is at baseline.  Psychiatric:        Mood and Affect: Mood normal.        Behavior: Behavior normal.        Thought Content: Thought content normal.        Judgment: Judgment normal.     Results for orders placed or performed in visit on 06/23/23  VITAMIN D  25 Hydroxy (Vit-D Deficiency, Fractures)   Collection Time: 06/23/23 11:08 AM  Result Value Ref Range   Vit D, 25-Hydroxy 30.2 30.0 - 100.0 ng/mL  Gliadin antibodies, serum   Collection Time: 06/23/23 11:08 AM  Result Value Ref Range   Antigliadin Abs, IgA 3 0 - 19 units   Gliadin IgG 2 0 - 19 units  Tissue transglutaminase, IgA   Collection Time: 06/23/23 11:08 AM  Result Value Ref Range   Transglutaminase IgA <2 0 - 3 U/mL  Reticulin Antibody, IgA w reflex titer   Collection Time: 06/23/23 11:08 AM  Result Value Ref Range   Reticulin Ab, IgA Negative Neg:<1:2.5 titer      Assessment & Plan:   Problem List Items Addressed This Visit       Cardiovascular and Mediastinum   Migraine   Under good control on current regimen. Continue current regimen. Continue to monitor. Call with any concerns. Refills given.        Relevant Medications   sertraline  (ZOLOFT ) 100 MG tablet   SUMAtriptan  (IMITREX ) 100 MG tablet   SUMAtriptan  6 MG/0.5ML SOAJ     Respiratory   Asthma, moderate persistent   Under good control on current regimen. Continue current regimen. Continue to monitor. Call with any concerns. Refills given.        Relevant Medications   fluticasone -salmeterol (WIXELA INHUB) 100-50 MCG/ACT AEPB   levalbuterol  (XOPENEX  HFA) 45 MCG/ACT inhaler   Tiotropium Bromide Monohydrate  (SPIRIVA  RESPIMAT) 2.5 MCG/ACT AERS     Other   Acute anxiety -  Primary   Will increase her sertraline  to 100mg . Call with any concerns or  if acting up. Continue to monitor.       Relevant Medications   busPIRone  (BUSPAR ) 5 MG tablet   sertraline  (ZOLOFT ) 100 MG tablet     Follow up plan: Return in about 6 months (around 09/27/2024).

## 2024-03-31 ENCOUNTER — Other Ambulatory Visit: Payer: Self-pay | Admitting: Family Medicine

## 2024-04-05 NOTE — Assessment & Plan Note (Signed)
 Under good control on current regimen. Continue current regimen. Continue to monitor. Call with any concerns. Refills given.

## 2024-04-05 NOTE — Assessment & Plan Note (Signed)
 Will increase her sertraline  to 100mg . Call with any concerns or if acting up. Continue to monitor.

## 2024-06-26 ENCOUNTER — Other Ambulatory Visit: Payer: Self-pay | Admitting: Family Medicine

## 2024-07-28 ENCOUNTER — Other Ambulatory Visit: Payer: Self-pay | Admitting: Family Medicine

## 2024-07-28 NOTE — Telephone Encounter (Signed)
 Requested Prescriptions  Pending Prescriptions Disp Refills   busPIRone  (BUSPAR ) 5 MG tablet [Pharmacy Med Name: busPIRone  HCl 5 MG Oral Tablet] 270 tablet 0    Sig: TAKE 1 TO 3 TABLETS BY MOUTH THREE TIMES DAILY AS NEEDED     Psychiatry: Anxiolytics/Hypnotics - Non-controlled Passed - 07/28/2024  1:46 PM      Passed - Valid encounter within last 12 months    Recent Outpatient Visits           4 months ago Acute anxiety    [facility] [place], [place], [place]

## 2024-08-03 ENCOUNTER — Other Ambulatory Visit: Payer: Self-pay | Admitting: Family Medicine

## 2024-08-04 NOTE — Telephone Encounter (Signed)
 Rx 06/29/24 #90- too soon Requested Prescriptions  Pending Prescriptions Disp Refills   sertraline  (ZOLOFT ) 100 MG tablet [Pharmacy Med Name: Sertraline  HCl 100 MG Oral Tablet] 90 tablet 0    Sig: Take 1 tablet by mouth once daily     Psychiatry:  Antidepressants - SSRI - sertraline  Failed - 08/04/2024 12:19 PM      Failed - AST in normal range and within 360 days    AST  Date Value Ref Range Status  12/26/2022 15 0 - 40 IU/L Final         Failed - ALT in normal range and within 360 days    ALT  Date Value Ref Range Status  12/26/2022 11 0 - 32 IU/L Final         Passed - Completed PHQ-2 or PHQ-9 in the last 360 days      Passed - Valid encounter within last 6 months    Recent Outpatient Visits           4 months ago Acute anxiety    [facility] [place], [place], [place]

## 2024-09-27 ENCOUNTER — Ambulatory Visit: Admitting: Family Medicine
